# Patient Record
Sex: Female | Born: 1970 | Race: White | Hispanic: No | Marital: Married | Smoking: Never smoker
Health system: Southern US, Community
[De-identification: ages and names within clinical notes are randomized; demographics above are authoritative.]

## PROBLEM LIST (undated history)

## (undated) DIAGNOSIS — G510 Bell's palsy: Secondary | ICD-10-CM

## (undated) DIAGNOSIS — F909 Attention-deficit hyperactivity disorder, unspecified type: Secondary | ICD-10-CM

## (undated) DIAGNOSIS — D649 Anemia, unspecified: Secondary | ICD-10-CM

## (undated) DIAGNOSIS — F419 Anxiety disorder, unspecified: Secondary | ICD-10-CM

## (undated) HISTORY — PX: ABDOMINAL HYSTERECTOMY: SHX81

## (undated) HISTORY — DX: Anxiety disorder, unspecified: F41.9

## (undated) HISTORY — DX: Anemia, unspecified: D64.9

## (undated) HISTORY — DX: Bell's palsy: G51.0

## (undated) HISTORY — PX: LAPAROSCOPIC HYSTERECTOMY: SHX1926

---

## 2007-11-05 ENCOUNTER — Encounter: Admission: RE | Admit: 2007-11-05 | Discharge: 2007-11-05 | Payer: Self-pay | Admitting: Obstetrics and Gynecology

## 2008-05-05 ENCOUNTER — Encounter: Admission: RE | Admit: 2008-05-05 | Discharge: 2008-05-05 | Payer: Self-pay | Admitting: Obstetrics and Gynecology

## 2009-05-23 ENCOUNTER — Encounter: Admission: RE | Admit: 2009-05-23 | Discharge: 2009-05-23 | Payer: Self-pay | Admitting: Obstetrics & Gynecology

## 2009-06-26 ENCOUNTER — Ambulatory Visit (HOSPITAL_COMMUNITY): Admission: RE | Admit: 2009-06-26 | Discharge: 2009-06-27 | Payer: Self-pay | Admitting: Obstetrics & Gynecology

## 2009-06-26 ENCOUNTER — Encounter: Payer: Self-pay | Admitting: Obstetrics & Gynecology

## 2011-04-07 LAB — URINALYSIS, ROUTINE W REFLEX MICROSCOPIC
Glucose, UA: NEGATIVE mg/dL
Ketones, ur: NEGATIVE mg/dL
Protein, ur: NEGATIVE mg/dL
Specific Gravity, Urine: 1.015 (ref 1.005–1.030)
pH: 7.5 (ref 5.0–8.0)

## 2011-04-07 LAB — BASIC METABOLIC PANEL
BUN: 4 mg/dL — ABNORMAL LOW (ref 6–23)
BUN: 9 mg/dL (ref 6–23)
Calcium: 9.2 mg/dL (ref 8.4–10.5)
Chloride: 104 mEq/L (ref 96–112)
Creatinine, Ser: 0.54 mg/dL (ref 0.4–1.2)
GFR calc non Af Amer: >60 mL/min (ref >60)
GFR calc non Af Amer: >60 mL/min (ref >60)
Potassium: 4.3 mEq/L (ref 3.5–5.1)
Potassium: 4.6 mEq/L (ref 3.5–5.1)
Sodium: 137 mEq/L (ref 135–145)

## 2011-04-07 LAB — TYPE AND SCREEN: Antibody Screen: NEGATIVE

## 2011-04-07 LAB — CBC
HCT: 30.5 % — ABNORMAL LOW (ref 36.0–46.0)
HCT: 39 % (ref 36.0–46.0)
Hemoglobin: 10.5 g/dL — ABNORMAL LOW (ref 12.0–15.0)
MCHC: 34 g/dL (ref 30.0–36.0)
MCHC: 34.5 g/dL (ref 30.0–36.0)
MCV: 88.2 fL (ref 78.0–100.0)
RBC: 3.45 MIL/uL — ABNORMAL LOW (ref 3.87–5.11)
RDW: 14.2 % (ref 11.5–15.5)
RDW: 14.3 % (ref 11.5–15.5)
WBC: 11.6 10*3/uL — ABNORMAL HIGH (ref 4.0–10.5)

## 2011-04-07 LAB — ABO/RH: ABO/RH(D): O POS

## 2011-04-07 LAB — HCG, SERUM, QUALITATIVE: Preg, Serum: NEGATIVE

## 2011-04-16 ENCOUNTER — Other Ambulatory Visit: Payer: Self-pay | Admitting: Obstetrics & Gynecology

## 2011-04-16 DIAGNOSIS — Z1231 Encounter for screening mammogram for malignant neoplasm of breast: Secondary | ICD-10-CM

## 2011-04-22 IMAGING — MG MM SCREEN MAMMOGRAM BILATERAL
5 series · 5 of 5 positions shown · non-contrast
Comparison: none

DG SCREEN MAMMOGRAM BILATERAL
Bilateral CC and MLO view(s) were taken.

DIGITAL SCREENING MAMMOGRAM WITH CAD:
The breast tissue is heterogeneously dense.  No masses or malignant type calcifications are 
identified.  Compared with prior studies.

[R CC]
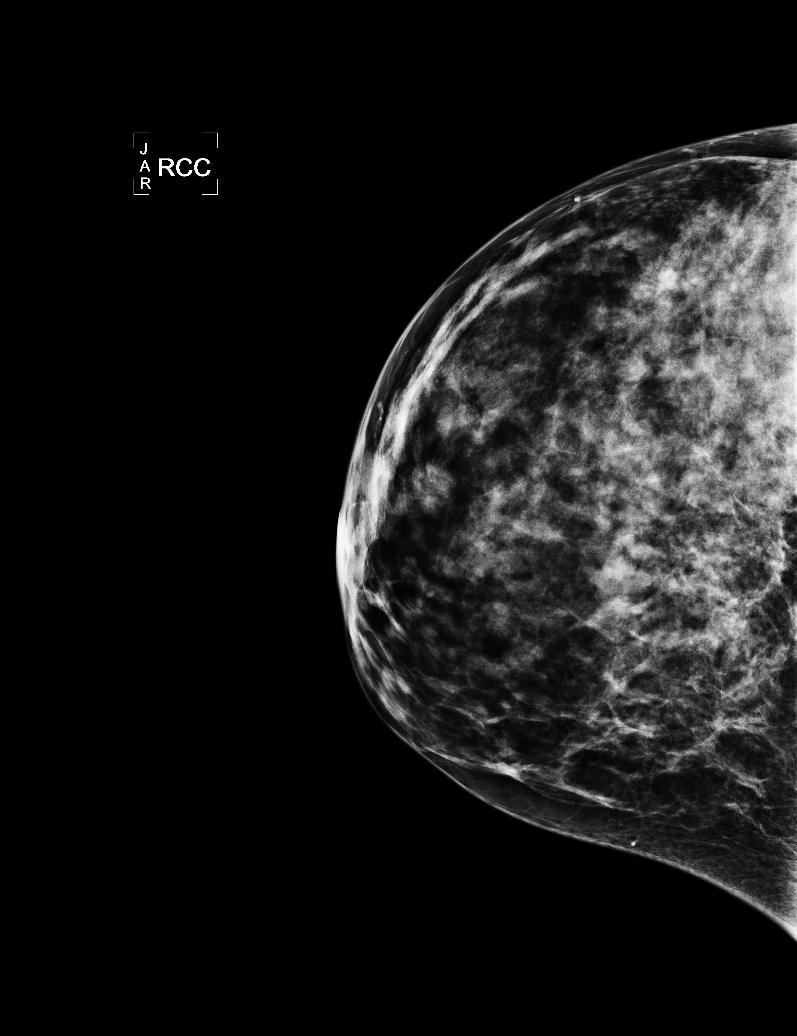

[L CC]
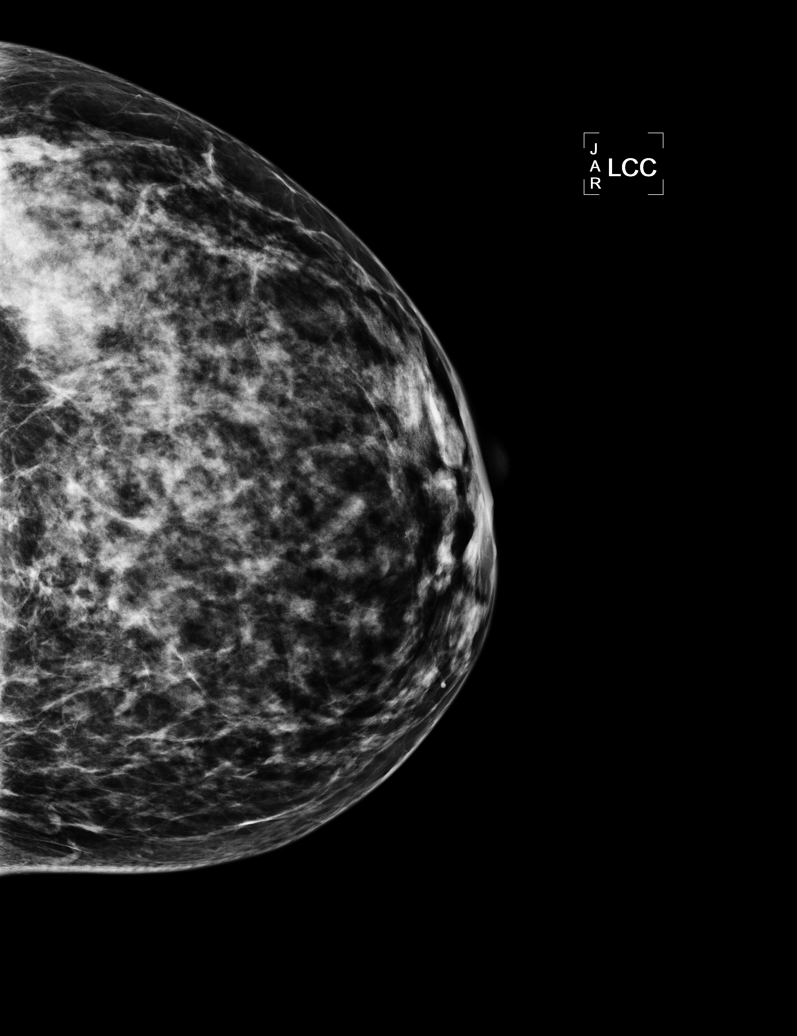

[L MLO]
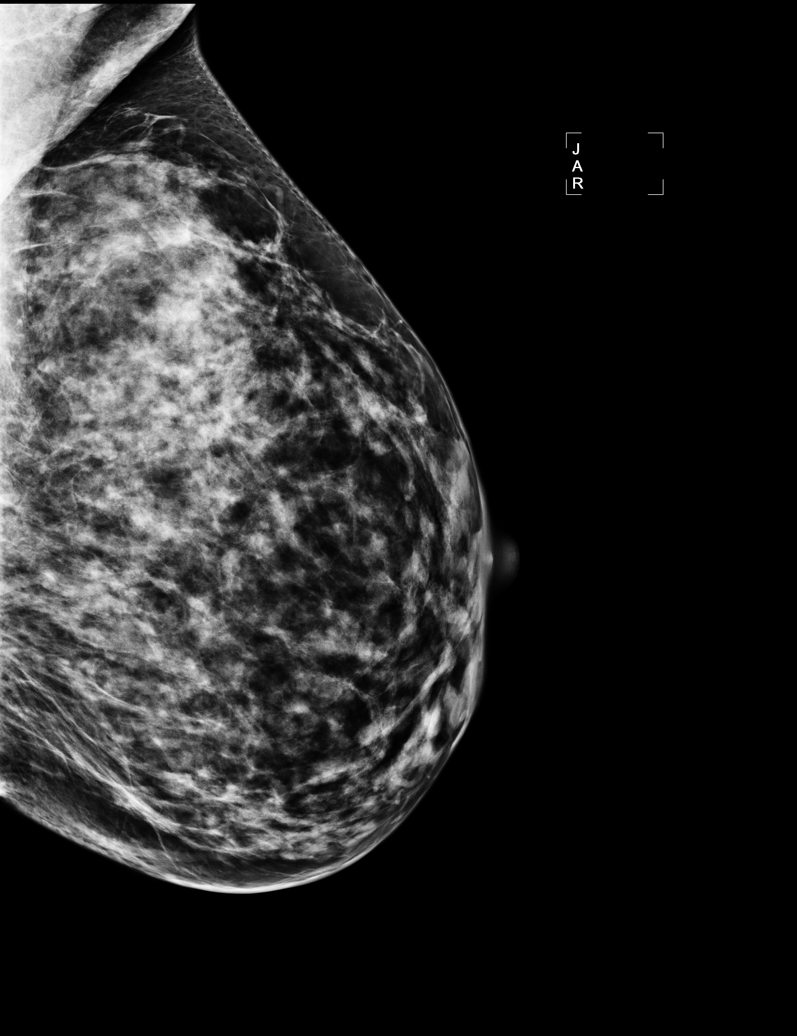

[R MLO (1 of 2)]
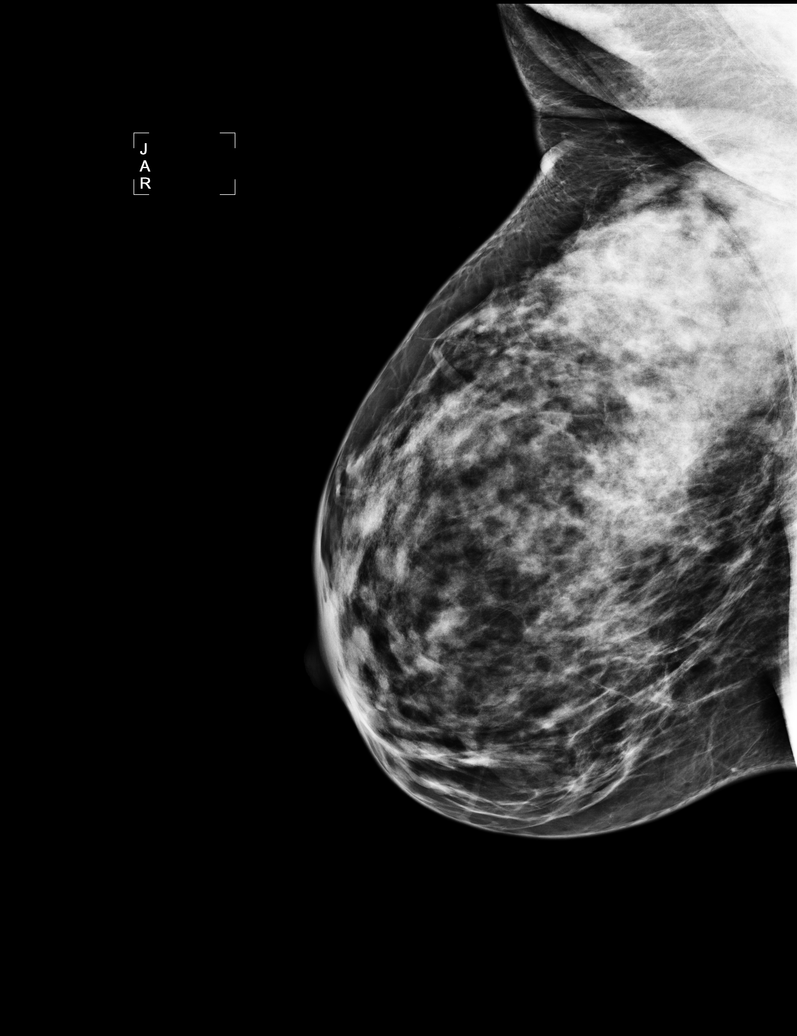

[R MLO (2 of 2)]
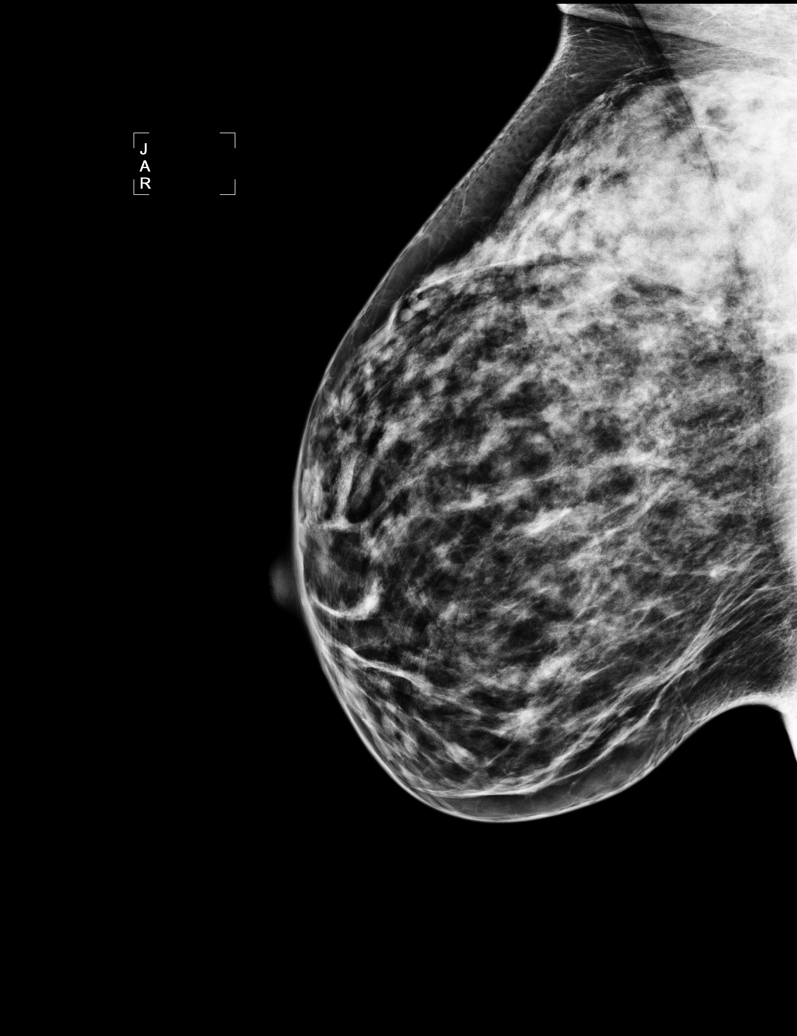

[5 of 5 positions shown; findings below may reference images not displayed]

IMPRESSION: No specific mammographic evidence of malignancy.  Next screening mammogram is recommended in one 
year.

A result letter of this screening mammogram will be mailed directly to the patient.

ASSESSMENT: Negative - BI-RADS 1

Screening mammogram in 1 year.
ANALYZED BY COMPUTER AIDED DETECTION. , THIS PROCEDURE WAS A DIGITAL MAMMOGRAM.

## 2011-05-07 ENCOUNTER — Ambulatory Visit: Payer: Self-pay

## 2011-05-13 NOTE — Op Note (Signed)
[name], [name]             ACCOUNT NO.:  [id number]   MEDICAL RECORD NO.:  [id number]          PATIENT TYPE:  OIB   LOCATION:  9319                          FACILITY:  [facility]   PHYSICIAN:  M. Leda Quail, MD  DATE OF BIRTH:  08/16/1971   DATE OF PROCEDURE:  DATE OF DISCHARGE:                               OPERATIVE REPORT   PREOPERATIVE DIAGNOSES:  67. A 40 year old G1 P1 married white female with menorrhagia.  2. Fibroid uterus.  3. Attention deficit disorder with hyperactivity.  4. Anxiety.   POSTOPERATIVE DIAGNOSES:  72. A 40 year old G1 P1 married white female with menorrhagia.  2. Fibroid uterus.  3. Attention deficit disorder with hyperactivity.  4. Anxiety.  5. Endometriosis.   PROCEDURES:  TLH with vaginal cuff closure, laparoscopic LSO, and  cautery of endometriosis.   SURGEON:  M. Leda Quail, MD   ASSISTANT:  Edwena Felty. Romine, MD   ANESTHESIA:  General endotracheal, Dr. Malen Gauze oversaw the case.   FINDINGS:  Endometriosis on bilateral ovaries with adhesions of the  right ovary to the sidewall.  These were filmy adhesions.  The left  ovary was adherent to the left sidewall.  There was endometriosis of the  anterior abdominal wall of the left uterosacral ligament and in the  posterior cul-de-sac.   SPECIMENS:  Uterus, cervix, left tube and ovary sent to pathology.   ESTIMATED BLOOD LOSS:  300 mL.   URINE OUTPUT:  350 mL.   FLUIDS:  2400 mL of LR.   COMPLICATIONS:  None.   INDICATIONS:  [name] is a 40 year old G1 P1 married white female  who is completely done with childbearing and suffers from significant  menorrhagia associated with pelvic pain.  She underwent an ultrasound  for evaluation which showed multiple fibroids.  This was done after her  uterus felt enlarged on exam.  She was initially considering an  ablation.  We discussed risks and benefits including the fact that  possible endometriosis or adenomyosis could be present with  fibroids.  She considered seriously failure rates of alternative therapies  including an IUD and endometrial ablation.  She ultimately decided to  proceed with definitive management.  Risks and benefits of all these  have been discussed with her and documented in our office chart.   PROCEDURE:  The patient was taken to the operating room.  She was placed  in supine position.  Anesthesia was administered by the anesthesia staff  without difficulty.  Legs were positioned in low lithotomy position in  Alcalde stirrups.  The abdomen, perineum, inner thighs, and vagina were  prepped in normal sterile fashion.  She was draped in normal sterile  fashion.  Attention was initially turned to the vagina.  Legs were  positioned to the high lithotomy position.  A short weighted speculum  placed in the posterior aspect of the vagina.  Cervix was visualized was  grasped with a single-tooth tenaculum.  The uterus was sounded to 10 cm.  The RUMI uterine manipulator with a #10 disposable tip was obtained.  This plus vaginal occlusive device was placed on the RUMI uterine  manipulator.  Cervix was dilated up to #21 with Shawnie Pons dilators.  Then  the RUMI uterine manipulator with a large Koh ring attached was passed  through the cervical canal.  The Koh ring was applied around the cervix  without difficulty.  Then the 5 mm balloon on the disposable RUMI tip  was inflated to hold the uterus in place.  There was good seal of the  Koh ring around the cervix and there was also good manipulation of the  uterus.  Foley was inserted under sterile conditions to straight drain.  The legs were positioned back in the low lithotomy position and the  heavy weighted speculum was removed from the posterior aspect of the  vagina.  Sterile gown and gloves were changed by the operator this  point.  Attention was turned to the abdomen.  A 5 mL of 0.25% Marcaine  were instilled infraumbilically.  A 10 mm skin incision was made with  a  #11 blade.  The abdominal wall layer was elevated and a Veress needle  was inserted into the abdomen to direct our aiming toward the pelvis.  Once peritoneum was popped through, a syringe of saline was obtained.  This was attached to the Veress needle and aspiration was performed,  without any blood or fluid being noted.  Fluid was injected easily in  the pelvis and aspiration was performed again without any blood or  fluid.  Fluid dripped easily into the pelvis at this point.  CO2 gas was  attached to the Veress needle and under low flow a pneumoperitoneum was  achieved without difficulty.  Once 2-1/2 liters of gas was in the  abdomen, the Veress needle was removed.  A 10-mm Optiview non-bladed  trocar and port were obtained.  These were attached to the laparoscope.  Under direct visualization of the laparoscope, the abdominal wall layers  were traversed as the Optiview scope was passed into the abdomen.  The  non-bladed trocar was removed.  The patient was placed in Trendelenburg.  The pelvis and upper abdomen were surveyed.  There was endometriosis on  the right ovary.  The left ovary was adhered to the left sidewall  although it was freed by manipulation of the uterus.  There was  endometriosis on the backside of the ovary.  There was endometriosis on  the left uterosacral ligament and posterior cul-de-sac and as well on  the anterior abdominal wall.  The upper abdomen was surveyed.  The  appendix was normal.  The gallbladder was normal.  The liver looked  normal and the stomach was normal.  Photo documentation was obtained.  Then the abdominal wall was transilluminated and port site locations in  the right and left lower quadrant were chosen.  Skin was anesthetized  with 0.25% Marcaine and 5 mm skin incisions were made with a #11 blade.  The 5 mm non-bladed trocar ports were placed in the right and left lower  quadrant under direct visualization of laparoscope.  The trocars were   removed.  Then a million dollar endoscopic grasper and gyrus, bipolar  cautery was obtained.  Decision was made at this point to keep the right  ovary and treat the endometriosis on the ovary and to take the left  ovary because of the adherent adhesions.  The adhesions on the right  ovary were incised sharply with endoscopic scissors.  The uterus was  then placed on stretch to the patient's right side.  The left IP  ligament was serially  clamped, cauterized, and incised.  The ureter was  visualized before and after transecting the IP ligament.  The left round  ligament was serially clamped, cauterized, and incised.  The filmy  tissue of the inferior portion of the broad ligament was serially  clamped, cauterized, and incised.  Then, using endoscopic scissors, the  anterior peritoneum was incised to the level of the internal os  anteriorly and then across the backside of the uterus.  This helped free  the ureter and allow the ureter to fall away from the uterine artery on  left side.  The left uterine artery was skeletonized at this point.  Then the avascular tissue between the bladder and the cervix was  identified.  The bladder was pushed down with the cervix to the level  over the [facility] ring.  Then the left uterine artery was serially  cauterized, but not incised at this point.  Then attention was turned to  the right side.  The uterus was placed on stretch to the left.  The  utero-ovarian pedicle was serially clamped, cauterized, and incised.  The right round ligament was clamped, cauterized, and incised.  The  remainder of the inferior leaf of the broad ligament was clamped,  cauterized, and incised.  Then the anterior peritoneum was incised with  endoscopic scissors to the level of the internal os meeting the previous  incision.  The bladder flap was created.  The avascular tissue between  the bladder and the cervix was also identified on this side and the  bladder was pushed down on  the cervix on both sides equally to place the  bladder below the level of the Koh ring.  The uterine artery on the  right side was skeletonized.  The posterior peritoneum, posterior to the  uterus was incised allowing the ureter to fall out to the right side.  Then at the level of the internal os, the uterine artery on the right  side was serially clamped and incised.  Then at the level of the [facility]  ring, the uterine artery was again serially clamped, cauterized and  incised, allowing a place for the colpotomy to be performed medial to  the transection of the uterine artery.  Attention was turned back to the  left side and the uterine artery at approximately the level of the [facility]  ring was serially clamped, cauterized, and incised.  There was good  hemostasis from the uterine arteries bilaterally.  Using monopolar  cautery with endoscopic scissor tip, colpotomy was performed starting  anteriorly working around the cervix inside the location of the  transection of the uterine artery posteriorly, inside the uterosacral  ligaments and then completing over on the left side.  At this point, the  uterus, cervix, the left tube, and ovary were removed from the pelvis  vaginally.  Legs were positioned in the high lithotomy position.  The  patient was taken out of the Trendelenburg positioning.  The vaginal  cuff was closed with six figure-of-eight sutures of 0 Vicryl.  There was  bleeding along the vaginal cuff, there was a small bumper that was  eventually identified and sutured.  Then at this point attention was  turned back to the abdomen.  Pneumoperitoneum was re-achieved.  The  pelvis was irrigated.  There was a small amount of bleeding on the  posterior aspect of the vaginal cuff which was cauterized with bipolar  cautery.  This was visualized for several minutes to ensure there was no  additional bleeding.  Ureter peristalsis was noted bilaterally.  Then  using monopolar cautery, the  endometriosis that was present in the  posterior cul-de-sac on the uterosacral ligament along the left sidewall  and the anterior abdominal wall and the right ovary were cauterized  using monopolar cautery.  Care was taken to identify all important  structures before any cauterization was performed in any location.  At  this point, the procedure was ended.  The cuff was revisualized and it  was irrigated with normal saline.  No bleeding was noted.  Pedicles were  all hemostatic at this point.  The right and left lower quadrant ports  removed under direct visualization of laparoscope.  The pneumoperitoneum  was released.  The laparoscope was removed.  The patient was given  several deep breaths by the CRNA to try and as much gas out of the  abdomen as possible.  Then the midline port was removed.  Using S  retractors at the umbilicus, fascia was identified, it was elevated and  sutured with a figure-of-eight suture of 0 Vicryl.  Skin was then closed  with subcuticular stitch of 3-0 Vicryl.  Dermabond was applied to this  incision to make it airtight.  The right and left lower quadrants  incisions were washed and dried and closed using Dermabond.  Foley  catheter was left in the bladder.  The vagina was wiped with a sponge  stick with Ray-Tec present, minimal spotting was noted.  At this point,  the patient was taking clearly out of Trendelenburg.  Her legs were  positioned back in supine position.  As the foot of the table was being  lifted, her right hand was visualized as it was tucked.  Sponge, lap,  needle, and instrument counts were correct x2.  Uterus, cervix, left  tube and ovary were sent to pathology.  The patient was awakened from  anesthesia, extubated in stable condition.  She was taken to the  recovery room at this point.      Lum Keas, MD     MSM/MEDQ  D:  06/26/2009  T:  06/27/2009  Job:  [id number]

## 2011-05-13 NOTE — Discharge Summary (Signed)
NAMELawrie, [name]             ACCOUNT NO.:  [id number]   MEDICAL RECORD NO.:  [id number]          PATIENT TYPE:  OIB   LOCATION:  9319                          FACILITY:  [facility]   PHYSICIAN:  M. Leda Quail, MD  DATE OF BIRTH:  14-Apr-1971   DATE OF ADMISSION:  06/26/2009  DATE OF DISCHARGE:  06/27/2009                               DISCHARGE SUMMARY   ADMISSION DIAGNOSES:  30. A 40 year old G1, P1 married white female with menorrhagia.  2. Fibroids.  3. Pelvic pain.  4. Attention deficit hyperactivity disorder.   DISCHARGE DIAGNOSES:  53. A 40 year old G1, P1 married white female with menorrhagia.  2. Fibroids.  3. Pelvic pain.  4. Attention deficit hyperactivity disorder.  5. Endometriosis.   PROCEDURES:  1. Total laparoscopic hysterectomy with vaginal cuff closure.  2. Laparoscopic salpingo-oophorectomy.  3. Cautery of endometriosis.   HISTORY OF PRESENT ILLNESS:  Written H and P is on the chart, but in  brief, [name] is a very nice 40 year old, G1, P1 married white  female, who has a history of significant menorrhagia and pelvic pain,  which is around the time of her cycle.  This has worsened over the last  several years.  She has been on multiple birth control options and none  have really helped significantly.  She is currently seeking a second  opinion about options.  We did perform an office ultrasound and  endometrial biopsy.  Ultrasound showed multiple fibroids.  No clear  adenomyosis or endometriosis was identified.  We discussed options  including a Mirena IUD, endometrial ablation, and a hysterectomy.  The  patient considered options and decided to proceed with hysterectomy  because she wanted definitive management.  Risks and benefits have been  discussed with her and are documented in her office chart.   HOSPITAL COURSE:  The patient was admitted to Same Day Surgery.  She was  taken to the operating room where a TLH, LSO, and cautery of  endometriosis was performed.  She had 300 mL of blood loss.  She was  very stable during the procedure.  From the operating room, she was  taken to the recovery room and from recovering room to the third floor  for the remainder of her hospitalization.  She was stable throughout the  night and the next morning when she was seen on rounds.  T-max was 98.4,  pulse was then 50s to 70s, respirations 14-20, BP 85-102/54-69.  Pulse  ox was 97-100% after her 2 liters of nasal cannula was discontinued.  The patient made 3800 mL of urine output, which was clear in the  previous 24 hours and by the time I saw her, she had already voided once  400 mL.  Postop labs showed a white count 11.6, hemoglobin 10.5,  platelet count 223.  Electrolytes were totally normal with a sodium 136,  potassium 4.3, BUN and creatinine 4 and 0.4 respectively.  Her exam was  completely benign.  Abdomen was soft, nondistended, and good bowel  sounds and mildly tender as expected.  Her incisions were clean, dry,  and intact.  Pad was  dry.  At this point, her IV was discontinued.  Her  Dilaudid PCA was discontinued.  Her Pepcid was discontinued.  She was  advanced to regular diet.  She was up to ambulate.  The plan will be for  her to be discharged to home later today.   Discharge instructions were provided in the written verbal form.  Specifically, she is to call if she has a temperature greater than  100.5, redness or drainage from her incisions, or heavy vaginal  bleeding.  She will be called with her pathology report.  Postoperative  followup is in 1 week, and she has prescriptions for Percocet 5/325 one  to two tablets was p.o. 4-6 hours p.r.n. pain and Motrin 800 mg 1 every  8 hours as needed for pain.      Lum Keas, MD     MSM/MEDQ  D:  06/27/2009  T:  06/27/2009  Job:  [id number]

## 2011-06-03 ENCOUNTER — Ambulatory Visit: Payer: Self-pay

## 2011-08-18 DIAGNOSIS — F988 Other specified behavioral and emotional disorders with onset usually occurring in childhood and adolescence: Secondary | ICD-10-CM | POA: Insufficient documentation

## 2011-08-18 DIAGNOSIS — F329 Major depressive disorder, single episode, unspecified: Secondary | ICD-10-CM | POA: Insufficient documentation

## 2011-08-18 DIAGNOSIS — F3342 Major depressive disorder, recurrent, in full remission: Secondary | ICD-10-CM | POA: Insufficient documentation

## 2011-09-11 ENCOUNTER — Ambulatory Visit
Admission: RE | Admit: 2011-09-11 | Discharge: 2011-09-11 | Disposition: A | Discharge status: Home / Self Care | Payer: BC Managed Care – PPO | Source: Ambulatory Visit | Attending: Obstetrics & Gynecology | Admitting: Obstetrics & Gynecology

## 2011-09-11 DIAGNOSIS — Z1231 Encounter for screening mammogram for malignant neoplasm of breast: Secondary | ICD-10-CM

## 2012-09-08 ENCOUNTER — Other Ambulatory Visit: Payer: Self-pay | Admitting: Obstetrics & Gynecology

## 2012-09-08 DIAGNOSIS — Z1231 Encounter for screening mammogram for malignant neoplasm of breast: Secondary | ICD-10-CM

## 2012-10-13 ENCOUNTER — Ambulatory Visit
Admission: RE | Admit: 2012-10-13 | Discharge: 2012-10-13 | Disposition: A | Discharge status: Home / Self Care | Payer: BC Managed Care – PPO | Source: Ambulatory Visit | Attending: Obstetrics & Gynecology | Admitting: Obstetrics & Gynecology

## 2012-10-13 DIAGNOSIS — Z1231 Encounter for screening mammogram for malignant neoplasm of breast: Secondary | ICD-10-CM

## 2012-12-08 ENCOUNTER — Other Ambulatory Visit: Payer: Self-pay | Admitting: Gynecology

## 2012-12-09 ENCOUNTER — Other Ambulatory Visit: Payer: Self-pay | Admitting: Gynecology

## 2012-12-09 DIAGNOSIS — N6320 Unspecified lump in the left breast, unspecified quadrant: Secondary | ICD-10-CM

## 2012-12-09 DIAGNOSIS — N644 Mastodynia: Secondary | ICD-10-CM

## 2012-12-17 ENCOUNTER — Ambulatory Visit
Admission: RE | Admit: 2012-12-17 | Discharge: 2012-12-17 | Disposition: A | Discharge status: Home / Self Care | Payer: BC Managed Care – PPO | Source: Ambulatory Visit | Attending: Gynecology | Admitting: Gynecology

## 2012-12-17 DIAGNOSIS — N6320 Unspecified lump in the left breast, unspecified quadrant: Secondary | ICD-10-CM

## 2012-12-17 DIAGNOSIS — N644 Mastodynia: Secondary | ICD-10-CM

## 2013-05-04 ENCOUNTER — Telehealth: Payer: Self-pay | Admitting: Obstetrics & Gynecology

## 2013-05-04 NOTE — Telephone Encounter (Signed)
Patient called to request a change in medication, Elestrin Gel, . On her last visit while you were out she saw another provider and was told due to her Estridiol level to use only one pump a day. Patient is calling to request she go back to 2 pumps a day as you originally had her on due to the way she is responding to only one pump Elestrin Gel a day. Please advise. Patient went ahead and make her AEX with you in July/2014 to be sure she gets to see you .

## 2013-05-04 NOTE — Telephone Encounter (Signed)
PRESCRIPTION QUESTION FOR A NURSE

## 2013-05-05 MED ORDER — ESTRADIOL 0.52 MG/0.87 GM (0.06%) TD GEL: 1.0000 "application " | Freq: Two times a day (BID) | TRANSDERMAL | Status: DC

## 2013-05-05 NOTE — Telephone Encounter (Signed)
She was having increased breast pain and nodularities in breast with the two pumps a day.  Also her estradiol level was quite high.  She should try alternating one pump one day, then two pumps the next.  This is a transdermal gel so it is safe to use it that way.  If her AEX is in July, then this will be a good time for discussing how it is working.

## 2013-05-05 NOTE — Telephone Encounter (Signed)
I placed order.   I haven't closed encounter.

## 2013-05-05 NOTE — Telephone Encounter (Signed)
Patient notified of Rx sent to pharmacy.

## 2013-05-05 NOTE — Telephone Encounter (Signed)
Patient understands instructions per Dr. Hyacinth Meeker and does have scheduled appt. In July for AEX. Patient also request if a new Rx could be sent to her pharmacy [facility] , [place], [place]. For Elestrin Gel so she may take advantage of a coupon for this Rx to be used. Please advise. sue

## 2013-05-30 ENCOUNTER — Telehealth: Payer: Self-pay | Admitting: Gynecology

## 2013-05-30 NOTE — Telephone Encounter (Signed)
Routed to Dr. Lathrop  

## 2013-05-30 NOTE — Telephone Encounter (Signed)
Her Rx for   Estradiol 0.52 MG/0.87 GM (0.06%) GEL [employer] does't want to pay for  Anymore.    [address]. [facility].

## 2013-05-31 ENCOUNTER — Telehealth: Payer: Self-pay

## 2013-05-31 MED ORDER — ESTRADIOL 0.05 MG/24HR TD PTWK: 1.0000 | MEDICATED_PATCH | TRANSDERMAL | Status: DC

## 2013-05-31 NOTE — Telephone Encounter (Signed)
Can you call [name] and see if she knows what is covered by her plan?

## 2013-05-31 NOTE — Telephone Encounter (Signed)
You will need to forward request to Dr Hyacinth Meeker then

## 2013-05-31 NOTE — Telephone Encounter (Signed)
Notified pt of this; pt says she's grateful for Dr. Wynelle Bourgeois opinion, but she's Dr. Rondel Baton pt and she would like Dr. Rondel Baton opinion. Elestrin Samples here expire 6/14, Please Advise.

## 2013-05-31 NOTE — Telephone Encounter (Signed)
I forwarded it to her

## 2013-05-31 NOTE — Telephone Encounter (Signed)
Patient notified of change. Will call back in one month with update or prn.

## 2013-05-31 NOTE — Telephone Encounter (Signed)
Climara 0.05mg  patches once weekly.  We may have to go up on dosage so she should call and give update about one month.

## 2013-05-31 NOTE — Telephone Encounter (Signed)
Can try either vivelle dot or oral estrogen at equivalent doses, ok to call either in for 30d with refill until Augest

## 2013-05-31 NOTE — Telephone Encounter (Signed)
Patient checked with her plan-climara and estrace are on the Tier 1, vivelle dot is Tier 2. She is fine with whatever Dr Hyacinth Meeker thinks is the best fit for her. Is doing really well on the elestrin and c/o changing causing weight gain.

## 2013-06-16 ENCOUNTER — Telehealth: Payer: Self-pay

## 2013-06-16 NOTE — Telephone Encounter (Signed)
Spoke with patient to offer her samples of the Elestrin. States she has already started the climara 0.05mg  patch x 3 weeks. States is not sleeping as well and achy feeling, but will continue to try the climara for a couple more weeks. Will call back to give update or if sx worsen.

## 2013-07-04 HISTORY — PX: MYRINGOTOMY WITH TUBE PLACEMENT: SHX5663

## 2013-07-05 ENCOUNTER — Inpatient Hospital Stay (HOSPITAL_COMMUNITY)
Admission: EM | Admit: 2013-07-05 | Discharge: 2013-07-07 | DRG: 019 | Disposition: A | Discharge status: Home / Self Care | Payer: BC Managed Care – PPO | Attending: Otolaryngology | Admitting: Otolaryngology

## 2013-07-05 ENCOUNTER — Inpatient Hospital Stay: Admission: AD | Admit: 2013-07-05 | Payer: Self-pay | Source: Ambulatory Visit | Admitting: Otolaryngology

## 2013-07-05 ENCOUNTER — Inpatient Hospital Stay (HOSPITAL_COMMUNITY): Payer: BC Managed Care – PPO

## 2013-07-05 ENCOUNTER — Encounter (HOSPITAL_COMMUNITY): Payer: Self-pay | Admitting: Emergency Medicine

## 2013-07-05 DIAGNOSIS — H6690 Otitis media, unspecified, unspecified ear: Secondary | ICD-10-CM | POA: Diagnosis present

## 2013-07-05 DIAGNOSIS — G510 Bell's palsy: Principal | ICD-10-CM | POA: Diagnosis present

## 2013-07-05 HISTORY — DX: Attention-deficit hyperactivity disorder, unspecified type: F90.9

## 2013-07-05 MED ORDER — ARTIFICIAL TEARS OP OINT
TOPICAL_OINTMENT | Freq: Every evening | OPHTHALMIC | Status: DC | PRN
  Administered 2013-07-05: 21:00:00 via OPHTHALMIC
  Filled 2013-07-05: qty 3.5

## 2013-07-05 MED ORDER — PIPERACILLIN-TAZOBACTAM 3.375 G IVPB
3.3750 g | Freq: Once | INTRAVENOUS | Status: AC
  Administered 2013-07-05: 3.375 g via INTRAVENOUS
  Filled 2013-07-05: qty 50

## 2013-07-05 MED ORDER — POLYVINYL ALCOHOL 1.4 % OP SOLN
1.0000 [drp] | OPHTHALMIC | Status: DC | PRN
  Administered 2013-07-05: 1 [drp] via OPHTHALMIC
  Filled 2013-07-05: qty 15

## 2013-07-05 MED ORDER — CIPROFLOXACIN-DEXAMETHASONE 0.3-0.1 % OT SUSP
4.0000 [drp] | Freq: Two times a day (BID) | OTIC | Status: DC
  Administered 2013-07-05 – 2013-07-07 (×4): 4 [drp] via OTIC
  Filled 2013-07-05: qty 7.5

## 2013-07-05 MED ORDER — METHYLPREDNISOLONE SODIUM SUCC 125 MG IJ SOLR
125.0000 mg | Freq: Once | INTRAMUSCULAR | Status: AC
  Administered 2013-07-05: 125 mg via INTRAVENOUS
  Filled 2013-07-05: qty 2

## 2013-07-05 MED ORDER — METHYLPREDNISOLONE SODIUM SUCC 125 MG IJ SOLR
125.0000 mg | Freq: Three times a day (TID) | INTRAMUSCULAR | Status: DC
  Administered 2013-07-05 – 2013-07-07 (×5): 125 mg via INTRAVENOUS
  Filled 2013-07-05 (×8): qty 2

## 2013-07-05 MED ORDER — PIPERACILLIN-TAZOBACTAM 3.375 G IVPB
3.3750 g | Freq: Three times a day (TID) | INTRAVENOUS | Status: DC
  Administered 2013-07-05 – 2013-07-07 (×5): 3.375 g via INTRAVENOUS
  Filled 2013-07-05 (×8): qty 50

## 2013-07-05 NOTE — Progress Notes (Signed)
[facility]  Patient Status: Active (receiving services up to time of hospitalization)  [facility] is providing the following services: RN Was referred for IV antibiotics but was admitted to the hospital prior to being seen by [facility].  If patient discharges after hours, please call [phone].   Kizzie Furnish 07/05/2013, 5:34 PM

## 2013-07-05 NOTE — H&P (Signed)
[name] is an 42 y.o. female.   Chief Complaint: Right otitis media and facial nerve paralysis HPI: 42 year old who developed an ear infection about a week ago. It did improve and apparently a perforation was present at that time evaluated by a otolaryngologist. She presented to the office yesterday with new onset facial nerve weakness and a middle ear effusion on the right. A tympanostomy tube was placed and there was serous effusion within the middle ear. Because of the weakness of the face it was felt that starting her on intravenous antibiotics for a short time was appropriate and [facility]. The have not even as of this morning started the antibiotics so she is now being admitted to the hospital for the therapy. She has not had this problem previously. Does not have her the current issue with otitis media. Has not had an upper respiratory infection. She has a numbness feeling of her ear preauricular area and also has a decreased sense of taste in the right side of her mouth. No headache or pressure behind the eye.  No past medical history on file.  No past surgical history on file.  No family history on file. Social History:  has no tobacco, alcohol, and drug history on file.  Allergies: Allergies not on file  No prescriptions prior to admission    No results found for this or any previous visit (from the past 48 hour(s)). No results found.  Review of Systems  Constitutional: Negative.   HENT: Positive for hearing loss and ear pain.   Eyes: Negative.   Respiratory: Negative.   Cardiovascular: Negative.   Skin: Negative.     There were no vitals taken for this visit. Physical Exam  Constitutional: She appears well-developed.  HENT:  Nose: Nose normal.  Mouth/Throat: Oropharynx is clear and moist.  Right ear w tube and some irritation of the canal.  The tube looks open. The right face has weakness of all branches but still some movement. No  erythema around the skin of the ear. Some pain in the mastoid area.  Eyes: Pupils are equal, round, and reactive to light.  Neck: Normal range of motion. Neck supple.  Cardiovascular: Normal rate.   Respiratory: Effort normal.  GI: Soft.     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 She also will get a high-resolution CT scan of the temporal bone.  Suzanna Obey 07/05/2013, 10:34 AM

## 2013-07-05 NOTE — ED Notes (Signed)
Pt presents to ED for IV start to be admitted to hospital for IV antibiotics.

## 2013-07-06 NOTE — Progress Notes (Signed)
Subjective: She was admitted for regression or facial nerve weakness in the face of otitis media. Her CT scan did not show any evidence of destructive bone or intracranial complications. There is no evidence of cholesteatoma. She feels well. She feels like perhaps the face is a little better. No pain. His no drainage from the ear.  Objective: Vital signs in last 24 hours: Temp:  [98 F (36.7 C)-98.6 F (37 C)] 98 F (36.7 C) (07/09 0556) Pulse Rate:  [87-104] 98 (07/09 0556) Resp:  [14-18] 18 (07/09 0556) BP: (111-132)/(64-86) 111/64 mmHg (07/09 0556) SpO2:  [97 %-100 %] 98 % (07/09 0556) Weight:  [72.576 kg (160 lb)-73.483 kg (162 lb)] 73.482 kg (162 lb) (07/08 1639) Last BM Date: 07/02/13  Intake/Output from previous day: 07/08 0701 - 07/09 0700 In: 410 [P.O.:360; IV Piggyback:50] Out: -  Intake/Output this shift:    She looks good. There is no evidence of erythema or redness of the ear. The facial nerve still is definitely weak and I cannot appreciate a significant improvement but there may be better tone. There are no vesicles or lesions.  Lab Results:  No results found for this basename: WBC, HGB, HCT, PLT,  in the last 72 hours BMET No results found for this basename: NA, K, CL, CO2, GLUCOSE, BUN, CREATININE, CALCIUM,  in the last 72 hours PT/INR No results found for this basename: LABPROT, INR,  in the last 72 hours ABG No results found for this basename: PHART, PCO2, PO2, HCO3,  in the last 72 hours  Studies/Results: Ct Temporal Bones W/o Cm  07/05/2013   *RADIOLOGY REPORT*  Clinical Data: Ear infection.  Right facial weakness.  Tympanic membrane perforation on the right.  CT TEMPORAL BONES WITHOUT CONTRAST  Technique:  Axial and coronal plane CT imaging of the petrous temporal bones was performed with thin-collimation image reconstruction.  No intravenous contrast was administered. Multiplanar CT image reconstructions were also generated.  Comparison: None.  Findings:  Normal right external ear.  Tympanostomy tube has been placed in the right tympanic membrane with slight retraction of the TM. There has been good drainage of the inferior right middle ear cavity.  The fluid which remains is more superomedial to the malleoincudal junction.   Right middle ear and mastoid fluid without signs of osseous destruction.  No fluid in Prussak's space.  Scutum preserved.  Ossicles intact.  No tegmen mastoideum or tegmen tympani destruction.  No visible destruction of the osseous facial nerve canal on the right either in its pre-geniculate, horizontal, or vertical components.  Normal appearing inner ear structures within limits of evaluation on CT.  Normal left ear.  Normal visualized intracranial compartment. Mucosal thickening is present in the bilateral ethmoid and maxillary sinuses.  IMPRESSION: Right otitis media and mastoid fluid consistent with acute otitis and mastoiditis.  No evidence for cholesteatoma or osseous destruction.  Visualized facial nerve bony canal is intact.  Right tympanostomy tube good position, with no significant fluid in the lower portion of the right middle ear.   Original Report Authenticated By: Davonna Belling, M.D.    Anti-infectives: Anti-infectives   Start     Dose/Rate Route Frequency Ordered Stop   07/05/13 1515  piperacillin-tazobactam (ZOSYN) IVPB 3.375 g     3.375 g 12.5 mL/hr over 240 Minutes Intravenous 3 times per day 07/05/13 1511     07/05/13 1315  piperacillin-tazobactam (ZOSYN) IVPB 3.375 g     3.375 g 12.5 mL/hr over 240 Minutes Intravenous  Once 07/05/13 1305  07/05/13 1712      Assessment/Plan: s/p * No surgery found * She continues with the facial nerve weakness. The CT scan showed mastoiditis but no cholesteatoma. We talked about options and she will stay for another day with intravenous steroids and antibiotics and then possibly plan on discharge tomorrow provided no new findings or problems.  LOS: 1 day    Suzanna Obey 07/06/2013

## 2013-07-07 NOTE — Discharge Summary (Signed)
Physician Discharge Summary  Patient ID: [name] MRN: [id number] DOB/AGE: 44-Sep-1972 44 y.o.  Admit date: 07/05/2013 Discharge date: 07/07/2013  Admission Diagnoses:facial paralysis and otitis media   Discharge Diagnoses: same Active Problems:   * No active hospital problems. *   Discharged Condition: good  Hospital Course: she was admitted for facial nerve paralysis and otitis media complication. She was placed on steroids and antibiotics. She is now doing well and has some improvement in the facial nerve. She's had no drainage. She's had no headache. She feels great and is ready to go home. She can now be transitioned to by mouth antibiotics and steroids. She will followup in one week. She understands to followup sooner if she is having any new issues or worsening problems.  Consults: None  Significant Diagnostic Studies: None   Treatments: none   Discharge Exam: Blood pressure 105/65, pulse 74, temperature 98.4 F (36.9 C), temperature source Oral, resp. rate 16, height 5\' 4"  (1.626 m), weight 73.482 kg (162 lb), SpO2 98.00%. Awake and alert. Very energetic and has no complaints. Her eyes without irritation or erythema. There is no tearing. She has improved movement in her face. She has no pain. There is no headaches. The ear is without drainage or evidence of swelling. Lungs are clear. Heart is regular. Abdomen is soft. Extremities without tenderness.  Disposition:    Future Appointments Provider Department Dept Phone   07/27/2013 10:00 AM Annamaria Boots, MD [facility] [phone]       Medication List         Biotin 10 MG Caps  Take 1 tablet by mouth daily.     buPROPion 200 MG 12 hr tablet  Commonly known as:  WELLBUTRIN SR  Take 200 mg by mouth 2 (two) times daily.     CALCIUM 1200 PO  Take 1 tablet by mouth 2 (two) times daily.     ciprofloxacin-dexamethasone otic suspension  Commonly known as:  CIPRODEX  Place 4 drops into the  right ear 2 (two) times daily.     estradiol 0.05 mg/24hr  Commonly known as:  CLIMARA - Dosed in mg/24 hr  Place 1 patch (0.05 mg total) onto the skin once a week.     ibuprofen 200 MG tablet  Commonly known as:  ADVIL,MOTRIN  Take 200-600 mg by mouth every 6 (six) hours as needed for pain.     levofloxacin 750 MG tablet  Commonly known as:  LEVAQUIN  Take 750 mg by mouth daily. For 10 days. Started 07/04/13     methylphenidate 20 MG tablet  Commonly known as:  RITALIN  Take 20 mg by mouth 2 (two) times daily.     multivitamin with minerals tablet  Take 1 tablet by mouth daily.         SignedSuzanna Obey 07/07/2013, 9:16 AM

## 2013-07-07 NOTE — Progress Notes (Signed)
  Subjective: She is doing well. The face is slightly better. No drainage . No headache. She feels great and ready to go home  Objective: Vital signs in last 24 hours: Temp:  [96.6 F (35.9 C)-98.5 F (36.9 C)] 98.4 F (36.9 C) (07/10 0630) Pulse Rate:  [74-98] 74 (07/10 0630) Resp:  [15-16] 16 (07/10 0630) BP: (105-118)/(65-70) 105/65 mmHg (07/10 0630) SpO2:  [98 %-100 %] 98 % (07/10 0630) Last BM Date: 07/02/13  Intake/Output from previous day: 07/09 0701 - 07/10 0700 In: -  Out: 3 [Urine:3] Intake/Output this shift:    still with facial weakness but slight movement. the eye is moist and no irritation. no drainage from ear.   Lab Results:  No results found for this basename: WBC, HGB, HCT, PLT,  in the last 72 hours BMET No results found for this basename: NA, K, CL, CO2, GLUCOSE, BUN, CREATININE, CALCIUM,  in the last 72 hours PT/INR No results found for this basename: LABPROT, INR,  in the last 72 hours ABG No results found for this basename: PHART, PCO2, PO2, HCO3,  in the last 72 hours  Studies/Results: Ct Temporal Bones W/o Cm  07/05/2013   *RADIOLOGY REPORT*  Clinical Data: Ear infection.  Right facial weakness.  Tympanic membrane perforation on the right.  CT TEMPORAL BONES WITHOUT CONTRAST  Technique:  Axial and coronal plane CT imaging of the petrous temporal bones was performed with thin-collimation image reconstruction.  No intravenous contrast was administered. Multiplanar CT image reconstructions were also generated.  Comparison: None.  Findings: Normal right external ear.  Tympanostomy tube has been placed in the right tympanic membrane with slight retraction of the TM. There has been good drainage of the inferior right middle ear cavity.  The fluid which remains is more superomedial to the malleoincudal junction.   Right middle ear and mastoid fluid without signs of osseous destruction.  No fluid in Prussak's space.  Scutum preserved.  Ossicles intact.  No tegmen  mastoideum or tegmen tympani destruction.  No visible destruction of the osseous facial nerve canal on the right either in its pre-geniculate, horizontal, or vertical components.  Normal appearing inner ear structures within limits of evaluation on CT.  Normal left ear.  Normal visualized intracranial compartment. Mucosal thickening is present in the bilateral ethmoid and maxillary sinuses.  IMPRESSION: Right otitis media and mastoid fluid consistent with acute otitis and mastoiditis.  No evidence for cholesteatoma or osseous destruction.  Visualized facial nerve bony canal is intact.  Right tympanostomy tube good position, with no significant fluid in the lower portion of the right middle ear.   Original Report Authenticated By: Davonna Belling, M.D.    Anti-infectives: Anti-infectives   Start     Dose/Rate Route Frequency Ordered Stop   07/05/13 1515  piperacillin-tazobactam (ZOSYN) IVPB 3.375 g     3.375 g 12.5 mL/hr over 240 Minutes Intravenous 3 times per day 07/05/13 1511     07/05/13 1315  piperacillin-tazobactam (ZOSYN) IVPB 3.375 g     3.375 g 12.5 mL/hr over 240 Minutes Intravenous  Once 07/05/13 1305 07/05/13 1712      Assessment/Plan: s/p * No surgery found * Discharge  LOS: 2 days    Suzanna Obey 07/07/2013

## 2013-07-14 DIAGNOSIS — G519 Disorder of facial nerve, unspecified: Secondary | ICD-10-CM | POA: Insufficient documentation

## 2013-07-25 ENCOUNTER — Telehealth: Payer: Self-pay | Admitting: Obstetrics & Gynecology

## 2013-07-25 NOTE — Telephone Encounter (Signed)
Patient needs refill on patch . Fell off . Has a week left on Rx.

## 2013-07-26 ENCOUNTER — Encounter: Payer: Self-pay | Admitting: Obstetrics & Gynecology

## 2013-07-26 NOTE — Telephone Encounter (Signed)
Spoke to pt.  Patch fell off this weekend.  Pt has appt with Dr. Hyacinth Meeker tomorrow.  She will wait until then for refill.

## 2013-07-26 NOTE — Telephone Encounter (Signed)
I have attempted to contact this patient by phone with the following results: left message to return my call on answering machine (home/mobile).  

## 2013-07-26 NOTE — Telephone Encounter (Signed)
Pt returned call, but no one was on line when I picked up.  RC from pt.  No answer.  Message left to return call at her convenience.

## 2013-07-27 ENCOUNTER — Encounter: Payer: Self-pay | Admitting: Obstetrics & Gynecology

## 2013-07-27 ENCOUNTER — Ambulatory Visit (INDEPENDENT_AMBULATORY_CARE_PROVIDER_SITE_OTHER): Payer: BC Managed Care – PPO | Admitting: Obstetrics & Gynecology

## 2013-07-27 VITALS — BP 122/72 | HR 60 | Resp 12 | Ht 66.0 in | Wt 162.2 lb

## 2013-07-27 DIAGNOSIS — Z01419 Encounter for gynecological examination (general) (routine) without abnormal findings: Secondary | ICD-10-CM

## 2013-07-27 DIAGNOSIS — N952 Postmenopausal atrophic vaginitis: Secondary | ICD-10-CM

## 2013-07-27 DIAGNOSIS — Z0000 Encounter for general adult medical examination without abnormal findings: Secondary | ICD-10-CM

## 2013-07-27 LAB — POCT URINALYSIS DIPSTICK
Bilirubin, UA: NEGATIVE
Blood, UA: NEGATIVE
Glucose, UA: NEGATIVE
Ketones, UA: NEGATIVE
Leukocytes, UA: NEGATIVE
Nitrite, UA: NEGATIVE

## 2013-07-27 LAB — LIPID PANEL
Cholesterol: 221 mg/dL — ABNORMAL HIGH (ref 0–200)
HDL: 83 mg/dL (ref >39)
Triglycerides: 164 mg/dL — ABNORMAL HIGH (ref <150)
VLDL: 33 mg/dL (ref 0–40)

## 2013-07-27 LAB — COMPREHENSIVE METABOLIC PANEL
Albumin: 4.5 g/dL (ref 3.5–5.2)
BUN: 12 mg/dL (ref 6–23)
Calcium: 9.4 mg/dL (ref 8.4–10.5)
Chloride: 102 mEq/L (ref 96–112)
Creat: 0.89 mg/dL (ref 0.50–1.10)
Glucose, Bld: 90 mg/dL (ref 70–99)
Potassium: 4.6 mEq/L (ref 3.5–5.3)

## 2013-07-27 LAB — ESTRADIOL: Estradiol: 83.2 pg/mL

## 2013-07-27 MED ORDER — ESTRADIOL 0.05 MG/24HR TD PTWK: 1.0000 | MEDICATED_PATCH | TRANSDERMAL | Status: DC

## 2013-07-27 NOTE — Progress Notes (Signed)
42 y.o. G1P1 MarriedCaucasianF here for annual exam.  Doing well from HRT.  Had ear infection that she ended up on antibiotics and having tube in ear.  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  Now, she is so much better.   Having some sleep issue.  Taking Tylenol PM.    Patient's last menstrual period was 05/29/2009.          Sexually active: yes  The current method of family planning is status post hysterectomy.    Exercising: no  not regularly Smoker:  no  Health Maintenance: Pap:  05/22/09 WNL History of abnormal Pap:  no MMG:  10/13/12 normal, 12/17/12 lrft diag/us-screening in one year Colonoscopy:  none BMD:   none TDaP:  Up to date with work Screening Labs: today, Hb today: 12.7, Urine today: PH-7.0   reports that she has never smoked. She has never used smokeless tobacco. She reports that she does not drink alcohol or use illicit drugs.  Past Medical History  Diagnosis Date  . ADHD (attention deficit hyperactivity disorder)   . Anxiety   . Anemia     Past Surgical History  Procedure Laterality Date  . Laparoscopic hysterectomy  ~ 2009    TLH/LSO, cautery of endo  . Myringotomy with tube placement Right 07/04/2013    "serious effusion" (07/05/2013)    Current Outpatient Prescriptions  Medication Sig Dispense Refill  . Biotin 10 MG CAPS Take 1 tablet by mouth daily.      Marland Kitchen buPROPion (WELLBUTRIN SR) 200 MG 12 hr tablet Take 200 mg by mouth 2 (two) times daily.      . Calcium Carbonate-Vit D-Min (CALCIUM 1200 PO) Take 1 tablet by mouth 2 (two) times daily.      . diphenhydramine-acetaminophen (TYLENOL PM) 25-500 MG TABS Take 1 tablet by mouth at bedtime as needed.      Marland Kitchen estradiol (CLIMARA - DOSED IN MG/24 HR) 0.05 mg/24hr Place 1 patch (0.05 mg total) onto the skin once a week.  4 patch  1  . MAGNESIUM PO Take by mouth daily.      . methylphenidate (RITALIN) 20 MG tablet Take 20 mg by mouth 2 (two) times daily.      . Multiple Vitamins-Minerals (MULTIVITAMIN WITH MINERALS)  tablet Take 1 tablet by mouth daily.      Marland Kitchen ibuprofen (ADVIL,MOTRIN) 200 MG tablet Take 200-600 mg by mouth every 6 (six) hours as needed for pain.       No current facility-administered medications for this visit.    Family History  Problem Relation Age of Onset  . Diabetes Paternal Grandmother   . Diabetes Maternal Aunt   . Breast cancer Maternal Grandmother   . Colon cancer Maternal Grandfather   . Lung cancer Paternal Grandfather     smoker  . Hypertension Father   . Hypertension Mother   . Heart disease Father     deceased age 52  . Osteoporosis Mother   . Osteoporosis Maternal Grandmother     ROS:  Pertinent items are noted in HPI.  Otherwise, a comprehensive ROS was negative.  Exam:   BP 122/72  Pulse 60  Resp 12  Ht 5\' 6"  (1.676 m)  Wt 162 lb 3.2 oz (73.573 kg)  BMI 26.19 kg/m2  LMP 05/29/2009  Weight change: -5lbs   Height: 5\' 6"  (167.6 cm)  Ht Readings from Last 3 Encounters:  07/27/13 5\' 6"  (1.676 m)  07/05/13 5\' 4"  (1.626 m)    General appearance: alert,  cooperative and appears stated age Head: Normocephalic, without obvious abnormality, atraumatic Neck: no adenopathy, supple, symmetrical, trachea midline and thyroid normal to inspection and palpation Lungs: clear to auscultation bilaterally Breasts: normal appearance, no masses or tenderness Heart: regular rate and rhythm Abdomen: soft, non-tender; bowel sounds normal; no masses,  no organomegaly Extremities: extremities normal, atraumatic, no cyanosis or edema Skin: Skin color, texture, turgor normal. No rashes or lesions Lymph nodes: Cervical, supraclavicular, and axillary nodes normal. No abnormal inguinal nodes palpated Neurologic: Grossly normal   Pelvic: External genitalia:  no lesions              Urethra:  normal appearing urethra with no masses, tenderness or lesions              Bartholins and Skenes: normal                 Vagina: normal appearing vagina with normal color and discharge, no  lesions              Cervix: absent              Pap taken: no Bimanual Exam:  Uterus:  uterus absent              Adnexa: no mass, fullness, tenderness               Rectovaginal: Confirms               Anus:  normal sphincter tone, no lesions  A:  Well Woman with normal exam H/O TLH/LSO due to fibroids and endometriosis  P:   Mammogram yearly No pap smear done TSH, Vit D, CMP, Lipids, Estradiol Climara patch 0.05mg  weekly.  Rx to pharmacy for 3 months. return annually or prn  An After Visit Summary was printed and given to the patient.

## 2013-07-27 NOTE — Patient Instructions (Signed)

## 2013-09-30 ENCOUNTER — Telehealth: Payer: Self-pay | Admitting: Obstetrics & Gynecology

## 2013-09-30 DIAGNOSIS — R5381 Other malaise: Secondary | ICD-10-CM

## 2013-09-30 NOTE — Telephone Encounter (Signed)
Last exam 07/27/13, restarted on Climara. States that she is not sleeping well, having joint aches, and feels that symptoms have returned as when she stopping using Elestrin.  She would like to have her estradiol levels rechecked since starting the Climara and wondering if you would enter labs to check adrenal system.

## 2013-10-04 NOTE — Telephone Encounter (Signed)
Can come in for that and am cortisol level.

## 2013-10-04 NOTE — Telephone Encounter (Signed)
After labs done, will need OV 2-3 days or so.

## 2013-10-05 NOTE — Telephone Encounter (Signed)
Spoke with patient. Lab appointment and OV scheduled with Dr. Hyacinth Meeker.

## 2013-10-06 ENCOUNTER — Other Ambulatory Visit: Payer: BC Managed Care – PPO

## 2013-10-06 DIAGNOSIS — R5383 Other fatigue: Secondary | ICD-10-CM

## 2013-10-06 DIAGNOSIS — R5381 Other malaise: Secondary | ICD-10-CM

## 2013-10-07 LAB — ESTRADIOL: Estradiol: 25.2 pg/mL

## 2013-10-13 ENCOUNTER — Ambulatory Visit (INDEPENDENT_AMBULATORY_CARE_PROVIDER_SITE_OTHER): Payer: BC Managed Care – PPO | Admitting: Obstetrics & Gynecology

## 2013-10-13 VITALS — BP 122/68 | HR 64 | Resp 16 | Ht 66.0 in | Wt 165.4 lb

## 2013-10-13 DIAGNOSIS — N951 Menopausal and female climacteric states: Secondary | ICD-10-CM

## 2013-10-13 MED ORDER — ESTRADIOL 0.1 MG/24HR TD PTWK: 1.0000 | MEDICATED_PATCH | TRANSDERMAL | Status: DC

## 2013-10-13 MED ORDER — PROGESTERONE MICRONIZED 200 MG PO CAPS: 200.0000 mg | ORAL_CAPSULE | Freq: Every day | ORAL | Status: DC

## 2013-10-13 NOTE — Patient Instructions (Signed)
Please give me an update within a week.

## 2013-10-13 NOTE — Progress Notes (Signed)
42 y.o. Married Caucasian female G1P1 here for discussion of what she hopes are menopausal symptoms.  Patient having more hot flashes and night sweats.  She is really having trouble sleeping.  She does not feel like herself and feels that she is having emotional swings that aren't good for a mother trying to home school daughter and for marriage.  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  Recent blood work reviewed with pt.  Cortisol normal.  Estradiol low at 25.2.  Feel HRT dosage needs adjusting.  Pt in agreement.  O: Healthy WD,WN female Affect: normal  A:Menopausal symptoms  P: Prometrium 200mg  nightly.  Advised will made sleepy so needs to take at night.  Pt aware can also increase moodiness so she will watch carefully.    Increase Climara to 0.1mg  weekly.  Patches RX to pharmacy as well.  Pt to give update over next month.  May need f/u again.  ~15 minutes spent with patient >50% of time was in face to face discussion of above.

## 2013-10-18 ENCOUNTER — Encounter: Payer: Self-pay | Admitting: Obstetrics & Gynecology

## 2013-10-19 ENCOUNTER — Encounter: Payer: Self-pay | Admitting: Obstetrics & Gynecology

## 2013-10-19 MED ORDER — PROGESTERONE MICRONIZED 100 MG PO CAPS: ORAL_CAPSULE | ORAL | Status: DC

## 2013-11-03 ENCOUNTER — Other Ambulatory Visit: Payer: Self-pay

## 2013-11-28 ENCOUNTER — Telehealth: Payer: Self-pay | Admitting: Obstetrics & Gynecology

## 2013-11-28 DIAGNOSIS — N950 Postmenopausal bleeding: Secondary | ICD-10-CM

## 2013-11-28 NOTE — Telephone Encounter (Signed)
Patient thinks she id due for blood work due to hormones.

## 2013-11-29 MED ORDER — MEDROXYPROGESTERONE ACETATE 2.5 MG PO TABS: 2.5000 mg | ORAL_TABLET | Freq: Every day | ORAL | Status: DC

## 2013-11-29 NOTE — Telephone Encounter (Signed)
1)  Have her return for estradiol.  Order placed.   2)  I think most of her symptoms were estrogen related which is why they have improved with the increased climara patch dosage. 3)  Mood issues could be Prometrium.  Will change progesterones to Provera 2.5mg  daily.  Order placed for this.  She can switch now or when out of the Prometrium.

## 2013-11-29 NOTE — Telephone Encounter (Signed)
Spoke with patient. She has been taking 100 mg Progesterone. Feels that her symptoms have improved, but still feeling "emotional and getting sad out of the clear blue."  Some sleep issues as well.  She wanted to know if it was time to get her Estradiol or any other hormones rechecked?

## 2013-11-30 NOTE — Telephone Encounter (Signed)
Spoke with patient and message from Dr. Hyacinth Meeker given. She is agreeable to plan. Will have lab drawn. Appointment scheduled.

## 2013-12-01 ENCOUNTER — Other Ambulatory Visit (INDEPENDENT_AMBULATORY_CARE_PROVIDER_SITE_OTHER): Payer: BC Managed Care – PPO

## 2013-12-01 DIAGNOSIS — N950 Postmenopausal bleeding: Secondary | ICD-10-CM

## 2014-01-03 ENCOUNTER — Other Ambulatory Visit: Payer: Self-pay

## 2014-01-03 DIAGNOSIS — Z1231 Encounter for screening mammogram for malignant neoplasm of breast: Secondary | ICD-10-CM

## 2014-01-08 ENCOUNTER — Encounter: Payer: Self-pay | Admitting: Obstetrics & Gynecology

## 2014-01-24 ENCOUNTER — Ambulatory Visit
Admission: RE | Admit: 2014-01-24 | Discharge: 2014-01-24 | Disposition: A | Discharge status: Home / Self Care | Payer: BC Managed Care – PPO | Source: Ambulatory Visit

## 2014-01-24 DIAGNOSIS — Z1231 Encounter for screening mammogram for malignant neoplasm of breast: Secondary | ICD-10-CM

## 2014-01-26 ENCOUNTER — Other Ambulatory Visit: Payer: Self-pay | Admitting: Obstetrics & Gynecology

## 2014-01-26 DIAGNOSIS — R928 Other abnormal and inconclusive findings on diagnostic imaging of breast: Secondary | ICD-10-CM

## 2014-02-02 ENCOUNTER — Ambulatory Visit
Admission: RE | Admit: 2014-02-02 | Discharge: 2014-02-02 | Disposition: A | Discharge status: Home / Self Care | Payer: Self-pay | Source: Ambulatory Visit | Attending: Obstetrics & Gynecology | Admitting: Obstetrics & Gynecology

## 2014-02-02 ENCOUNTER — Other Ambulatory Visit: Payer: Self-pay | Admitting: Obstetrics & Gynecology

## 2014-02-02 DIAGNOSIS — R921 Mammographic calcification found on diagnostic imaging of breast: Secondary | ICD-10-CM

## 2014-02-02 DIAGNOSIS — R928 Other abnormal and inconclusive findings on diagnostic imaging of breast: Secondary | ICD-10-CM

## 2014-02-06 DIAGNOSIS — H7290 Unspecified perforation of tympanic membrane, unspecified ear: Secondary | ICD-10-CM | POA: Insufficient documentation

## 2014-02-06 DIAGNOSIS — H722X1 Other marginal perforations of tympanic membrane, right ear: Secondary | ICD-10-CM | POA: Insufficient documentation

## 2014-03-13 ENCOUNTER — Emergency Department (HOSPITAL_COMMUNITY)
Admission: EM | Admit: 2014-03-13 | Discharge: 2014-03-13 | Disposition: A | Discharge status: Home / Self Care | Payer: BC Managed Care – PPO | Source: Home / Self Care | Attending: Emergency Medicine | Admitting: Emergency Medicine

## 2014-03-13 ENCOUNTER — Encounter (HOSPITAL_COMMUNITY): Payer: Self-pay | Admitting: Emergency Medicine

## 2014-03-13 DIAGNOSIS — F410 Panic disorder [episodic paroxysmal anxiety] without agoraphobia: Secondary | ICD-10-CM

## 2014-03-13 LAB — TSH: TSH: 1.521 u[IU]/mL (ref 0.350–4.500)

## 2014-03-13 MED ORDER — LORAZEPAM 1 MG PO TABS: 1.0000 mg | ORAL_TABLET | Freq: Three times a day (TID) | ORAL | Status: DC

## 2014-03-13 MED ORDER — ESCITALOPRAM OXALATE 10 MG PO TABS: 10.0000 mg | ORAL_TABLET | Freq: Every day | ORAL | Status: DC

## 2014-03-13 NOTE — ED Notes (Addendum)
TSH: 1.521.  Dr. Lorenz CoasterKeller wants pt. notified of normal result. Cherly AndersonYork, Laetitia Schnepf M 03/13/2014 I called and left a message to call.  Call 1. Pt. called back a few minutes later and was notified. 03/14/2014

## 2014-03-13 NOTE — ED Provider Notes (Signed)
Chief Complaint   Chief Complaint  Patient presents with  . 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 These have occurred both at work and at home. There is no specific pattern and no obvious precipitating factors. The episodes begin with a sensation that her throat is closing up, dry mouth, her heart races, she feels tremulous, notes tingling of her hands and feet, and has difficulty breathing. She also notes rapid heartbeat, dizziness, and lightheadedness. She had one of these at the hospital yesterday and had an EKG which was normal. She had another episode today and the rapid response team was called. Her vital signs were stable, glucose was normal. She was given a Xanax with almost immediate relief of her symptoms. She's under the care of Dr. Derrek GuSloan Manning and is on Wellbutrin 450 mg per day for ADD. She denies any depression. She denies being under any stress or family or work related problems.  Review of Systems   Other than as noted above, the patient denies any of the following symptoms: Systemic:  No fever, chills, fatigue, weight loss or gain. Resp:  No shortness of breath. Cardiovasc:  No chest pain, palpitations, dizziness, or syncope. GI:  No abdominal pain, nausea, vomiting, anorexia, diarrhea, or constipation. Neuro:  No headache, paresthesias, or tremor. Psych:  No sadness, depression, crying, anxiety, panic, sleep disturbance, or suicidal or homicidal ideation.  No hallucinations or delusions.  PMFSH   Past medical history, family history, social history, meds, and allergies were reviewed.    Physical Examination     Vital signs:  BP 109/68  Pulse 89  Resp 16  SpO2 100%  LMP 05/29/2009 Gen:  Alert, oriented, in no distress. Lungs:  No respiratory distress.  Breath sounds clear and equal bilaterally.  No  wheezes, rales, or rhonchi. Heart:  Regular rthythm.  No gallops, murmers, clicks or rubs. Abdomen:  Soft, flat and nontender.  No organomegaly or mass. Neuro:  Alert and oriented times 3. Speech clear, fluent and appropriate.  Cranial nerves intact.  No focal weakness. Psych:  Mood and affect normal.  Speech pattern normal.  Thought content normal with no suicidal or homicidal ideation.  No paranoia, hallucinations, or delusions.  Memory, insight, and judgement normal.  Assessment   The encounter diagnosis was Panic attack.   Plan   1.  Meds:  The following meds were prescribed:   Discharge Medication List as of 03/13/2014  2:03 PM    START taking these medications   Details  escitalopram (LEXAPRO) 10 MG tablet Take 1 tablet (10 mg total) by mouth daily., Starting 03/13/2014, Until Discontinued, Normal    LORazepam (ATIVAN) 1 MG tablet Take 1 tablet (1 mg total) by mouth every 8 (eight) hours., Starting 03/13/2014, Until Discontinued, Print        2.  Patient Education/Counseling:  The patient was given appropriate handouts, self care instructions, and instructed in symptomatic relief.  Suggested she cut back her Wellbutrin to 300 mg per day and add Lexapro. May take the Ativan as needed. Followup with Dr. Kathrynn RunningManning within the next week.  3.  Follow up:  The patient was told to follow up if no better in 3 to 4 days, if becoming worse in any way, and given some red flag symptoms such as worsening symptoms or suicidal ideation which would  prompt immediate return.       Reuben Likes, MD 03/13/14 [phone]

## 2014-03-13 NOTE — ED Notes (Signed)
Pt reports having a period of an hour and a half of feeling like she was not able to breath, shaking,  Dry mouth, weakness, and not able to stand.   Incident happened around 10 a.m this morning.    Pt states that she has been feeling this was for the past two weeks but today symptoms were worse.   States that she has been having episodes at night also that wake her from her sleep.   No hx of anxiety/pandic disorder.    Pt was given 1/2 mg of xanax from another [employer] worker, which has helped some.

## 2014-03-13 NOTE — Discharge Instructions (Signed)
Decrease Wellbutrin to 300 mg daily.   Panic Attacks Panic attacks are sudden, short-livedsurges of severe anxiety, fear, or discomfort. They may occur for no reason when you are relaxed, when you are anxious, or when you are sleeping. Panic attacks may occur for a number of reasons:   Healthy people occasionally have panic attacks in extreme, life-threatening situations, such as war or natural disasters. Normal anxiety is a protective mechanism of the body that helps us react to danger (fight or flight response).  Panic attacks are often seen with anxiety disorders, such as panic disorder, social anxiety disorder, generalized anxiety disorder, and phobias. Anxiety disorders cause excessive or uncontrollable anxiety. They may interfere with your relationships or other life activities.  Panic attacks are sometimes seen with other mental illnesses such as depression and posttraumatic stress disorder.  Certain medical conditions, prescription medicines, and drugs of abuse can cause panic attacks. SYMPTOMS  Panic attacks start suddenly, peak within 20 minutes, and are accompanied by four or more of the following symptoms:  Pounding heart or fast heart rate (palpitations).  Sweating.  Trembling or shaking.  Shortness of breath or feeling smothered.  Feeling choked.  Chest pain or discomfort.  Nausea or strange feeling in your stomach.  Dizziness, lightheadedness, or feeling like you will faint.  Chills or hot flushes.  Numbness or tingling in your lips or hands and feet.  Feeling that things are not real or feeling that you are not yourself.  Fear of losing control or going crazy.  Fear of dying. Some of these symptoms can mimic serious medical conditions. For example, you may think you are having a heart attack. Although panic attacks can be very scary, they are not life threatening. DIAGNOSIS  Panic attacks are diagnosed through an assessment by your health care provider.  Your health care provider will ask questions about your symptoms, such as where and when they occurred. Your health care provider will also ask about your medical history and use of alcohol and drugs, including prescription medicines. Your health care provider may order blood tests or other studies to rule out a serious medical condition. Your health care provider may refer you to a mental health professional for further evaluation. TREATMENT   Most healthy people who have one or two panic attacks in an extreme, life-threatening situation will not require treatment.  The treatment for panic attacks associated with anxiety disorders or other mental illness typically involves counseling with a mental health professional, medicine, or a combination of both. Your health care provider will help determine what treatment is best for you.  Panic attacks due to physical illness usually goes away with treatment of the illness. If prescription medicine is causing panic attacks, talk with your health care provider about stopping the medicine, decreasing the dose, or substituting another medicine.  Panic attacks due to alcohol or drug abuse goes away with abstinence. Some adults need professional help in order to stop drinking or using drugs. HOME CARE INSTRUCTIONS   Take all your medicines as prescribed.   Check with your health care provider before starting new prescription or over-the-counter medicines.  Keep all follow up appointments with your health care provider. SEEK MEDICAL CARE IF:  You are not able to take your medicines as prescribed.  Your symptoms do not improve or get worse. SEEK IMMEDIATE MEDICAL CARE IF:   You experience panic attack symptoms that are different than your usual symptoms.  You have serious thoughts about hurting yourself or others.  You are taking medicine for panic attacks and have a serious side effect. MAKE SURE YOU:  Understand these instructions.  Will watch  your condition.  Will get help right away if you are not doing well or get worse. Document Released: 12/15/2005 Document Revised: 10/05/2013 Document Reviewed: 07/29/2013 [facility] Patient Information 2014 [place], [place].

## 2014-03-14 ENCOUNTER — Encounter: Payer: Self-pay | Admitting: Obstetrics & Gynecology

## 2014-03-15 ENCOUNTER — Other Ambulatory Visit: Payer: Self-pay | Admitting: *Deleted

## 2014-03-15 ENCOUNTER — Ambulatory Visit (INDEPENDENT_AMBULATORY_CARE_PROVIDER_SITE_OTHER): Payer: BC Managed Care – PPO | Admitting: Obstetrics & Gynecology

## 2014-03-15 DIAGNOSIS — F411 Generalized anxiety disorder: Secondary | ICD-10-CM

## 2014-03-16 ENCOUNTER — Other Ambulatory Visit: Payer: Self-pay | Admitting: Obstetrics & Gynecology

## 2014-03-16 DIAGNOSIS — N951 Menopausal and female climacteric states: Secondary | ICD-10-CM

## 2014-03-16 LAB — TSH: TSH: 1.274 u[IU]/mL (ref 0.350–4.500)

## 2014-03-16 LAB — ESTRADIOL, FREE

## 2014-03-17 LAB — ESTRADIOL: Estradiol: 124.9 pg/mL

## 2014-03-17 LAB — PROGESTERONE: Progesterone: 10 ng/mL

## 2014-05-31 ENCOUNTER — Encounter: Payer: Self-pay | Admitting: Obstetrics & Gynecology

## 2014-06-06 ENCOUNTER — Telehealth: Payer: Self-pay | Admitting: Emergency Medicine

## 2014-06-06 NOTE — Telephone Encounter (Signed)
Called patient to schedule office visit with Dr. Hyacinth Meeker to discuss medications. She is agreeable. Appointment scheduled for 06/15/14 at 0830  Routing to provider for final review. Patient agreeable to disposition. Will close encounter

## 2014-06-15 ENCOUNTER — Telehealth: Payer: Self-pay | Admitting: Obstetrics & Gynecology

## 2014-06-15 ENCOUNTER — Encounter: Payer: Self-pay | Admitting: Obstetrics & Gynecology

## 2014-06-15 ENCOUNTER — Ambulatory Visit (INDEPENDENT_AMBULATORY_CARE_PROVIDER_SITE_OTHER): Payer: BC Managed Care – PPO | Admitting: Obstetrics & Gynecology

## 2014-06-15 VITALS — BP 110/70 | HR 68 | Ht 67.0 in | Wt 171.0 lb

## 2014-06-15 DIAGNOSIS — N951 Menopausal and female climacteric states: Secondary | ICD-10-CM

## 2014-06-15 MED ORDER — PROGESTERONE MICRONIZED 100 MG PO CAPS: 100.0000 mg | ORAL_CAPSULE | Freq: Every day | ORAL | Status: DC

## 2014-06-15 MED ORDER — ESTRADIOL 1 MG PO TABS: 1.0000 mg | ORAL_TABLET | Freq: Two times a day (BID) | ORAL | Status: DC

## 2014-06-15 MED ORDER — LORAZEPAM 1 MG PO TABS: 1.0000 mg | ORAL_TABLET | Freq: Three times a day (TID) | ORAL | Status: DC

## 2014-06-15 NOTE — Progress Notes (Addendum)
Patient ID: [name] F Boeve, female   DOB: 03/15/1971, 43 y.o.   MRN: [id number]  43 y.o. Married Caucasian female G1P1 here for discussion of hormonal symptoms.  Patient has been on Climara patch 0.1mg  weekly and Prometirum 200mg  nightly.  Reports in March, started having "episodes" at night where she was waking up and having extreme thirst.  Had an "episode" at work that was more severe.  Rapid response was called.  Blood sugar was fine.  Vitals are all sign.  Pt felt like she was "dying".  Evaluation was completely normal.  Somebody at work gave her a Xanax and this resolved all of the symptoms.    Saw PCP.  He suggested she decrease her progesterone dosage.  Prozac started and rx given for Ativan.  She doesn't feel like the Prozac did anything for her.  Had some lab work here.  Estradiol and Progesterone levels were appropriate.  Pt started taking half of the Progesterone.  The anxiety completely resolved.  She then tried taking none as she had a hysterectomy and knows that she doesn't "need" the progesterone.  She also cut the patch in 1/2 and she couldn't sleep at all.     O: Healthy WD,WN female  Affect: normal   A: Menopausal symptoms   P: Stop Climara.  Switch to estradiol 1mg  bid.  Pt will see over the next month if this helps.  She will stay on the same Prometrium dosage.  May end up using 50mg  but I'm only going to change one hormone at a time.  Pt in agreement with this and will communicate with me via MyChart regarding symptoms.  ~15 minutes spent with patient >50% of time was in face to face discussion of above.

## 2014-06-15 NOTE — Telephone Encounter (Signed)
Left a detailed message at number provided [phone]. Okay per ROI. Advised patient that rx for estradiol 1mg  BID was sent in to pharmacy of choice. Advised to call back if any further needs or questions.  Dr.Milleer, RX for Estradiol 1mg  BID #180 with 4RF sent in to patient's pharmacy per office visit notes.  Routing to provider for final review. Patient agreeable to disposition. Will close encounter.

## 2014-06-15 NOTE — Telephone Encounter (Signed)
Patient was seen by Dr. Hyacinth MeekerMiller this morning and called in from the pharmacy stating an RX for Estradiol was not called into the pharmacy. A clinical staff member confirmed it was not called in. Please call in RX to [facility] in [place] as soon as possible.

## 2014-06-29 ENCOUNTER — Encounter: Payer: Self-pay | Admitting: Obstetrics & Gynecology

## 2014-07-04 MED ORDER — NONFORMULARY OR COMPOUNDED ITEM: Status: DC

## 2014-07-04 NOTE — Telephone Encounter (Signed)
Will have pt use compounded progesterone cream 50mg /ml.  1-772ml to inner thigh.  Message sent to pt through MyChart--her preferred means of communication.  Rx called to [facility].

## 2014-07-05 ENCOUNTER — Telehealth: Payer: Self-pay | Admitting: Obstetrics & Gynecology

## 2014-07-05 NOTE — Telephone Encounter (Signed)
Ed Buzzy HanMeade calling from [facility] is calling to verify a prescription that was sent there on 07/04/14. He is not sure what the prescription is.

## 2014-07-05 NOTE — Telephone Encounter (Signed)
Mr. [name] was just calling to verify strength of the progesterone cream confirmed that it was  50 mg/ml  Just wanted to make sure before he compounded the progesterone, pharmacy aware.

## 2014-07-06 NOTE — Telephone Encounter (Signed)
Yes, that is correct 50mg /ml.  Please let them know.  Thanks.

## 2014-07-06 NOTE — Telephone Encounter (Signed)
Reggy EyeJasmine Shaw informed pharmacy yesterday of strength of rx  Encounter closed

## 2014-07-31 ENCOUNTER — Encounter: Payer: Self-pay | Admitting: Obstetrics & Gynecology

## 2014-08-07 ENCOUNTER — Telehealth: Payer: Self-pay | Admitting: Emergency Medicine

## 2014-08-07 NOTE — Telephone Encounter (Signed)
Message copied by Joeseph AmorFAST, Dayzee Trower L on Mon Aug 07, 2014  1:52 PM ------      Message from: Jerene BearsMILLER, MARY S      Created: Mon Aug 07, 2014  8:40 AM      Regarding: dr referral for pt       Pt asked me to refer her to Dr. Vincente PoliGrewal for HRT management.  I spoke to Dr. Vincente PoliGrewal personally today and she is not (and has not been) accepting new gyn patients for several years.  Please let her know I am sorry.  She could consider seeing Tammy Worrell or [facility] in [place] who uses a lot of bio-identicals.  She will not need a referral to either.            Dr. Hyacinth MeekerMiller ------

## 2014-08-07 NOTE — Telephone Encounter (Signed)
Message left to return call to [facility] at [phone]. Detailed message left to advise patient of message from Dr. Hyacinth MeekerMiller and to call back with any further questions. Okay for detailed message per designated party release form.

## 2014-10-04 ENCOUNTER — Other Ambulatory Visit: Payer: Self-pay | Admitting: Obstetrics & Gynecology

## 2014-10-04 DIAGNOSIS — R921 Mammographic calcification found on diagnostic imaging of breast: Secondary | ICD-10-CM

## 2014-10-12 ENCOUNTER — Ambulatory Visit: Payer: BC Managed Care – PPO | Admitting: Obstetrics & Gynecology

## 2014-10-17 ENCOUNTER — Ambulatory Visit
Admission: RE | Admit: 2014-10-17 | Discharge: 2014-10-17 | Disposition: A | Discharge status: Home / Self Care | Payer: BC Managed Care – PPO | Source: Ambulatory Visit | Attending: Obstetrics & Gynecology | Admitting: Obstetrics & Gynecology

## 2014-10-17 ENCOUNTER — Other Ambulatory Visit: Payer: Self-pay | Admitting: Obstetrics & Gynecology

## 2014-10-17 DIAGNOSIS — R921 Mammographic calcification found on diagnostic imaging of breast: Secondary | ICD-10-CM

## 2014-10-26 DIAGNOSIS — F411 Generalized anxiety disorder: Secondary | ICD-10-CM | POA: Insufficient documentation

## 2014-10-26 DIAGNOSIS — N951 Menopausal and female climacteric states: Secondary | ICD-10-CM | POA: Insufficient documentation

## 2014-10-30 ENCOUNTER — Encounter: Payer: Self-pay | Admitting: Obstetrics & Gynecology

## 2015-03-09 ENCOUNTER — Other Ambulatory Visit: Payer: Self-pay | Admitting: Obstetrics & Gynecology

## 2015-03-09 DIAGNOSIS — R921 Mammographic calcification found on diagnostic imaging of breast: Secondary | ICD-10-CM

## 2015-03-21 ENCOUNTER — Ambulatory Visit
Admission: RE | Admit: 2015-03-21 | Discharge: 2015-03-21 | Disposition: A | Discharge status: Home / Self Care | Payer: BC Managed Care – PPO | Source: Ambulatory Visit | Attending: Obstetrics & Gynecology | Admitting: Obstetrics & Gynecology

## 2015-03-21 DIAGNOSIS — R921 Mammographic calcification found on diagnostic imaging of breast: Secondary | ICD-10-CM

## 2016-04-29 ENCOUNTER — Other Ambulatory Visit: Payer: Self-pay | Admitting: Obstetrics & Gynecology

## 2016-04-29 DIAGNOSIS — R921 Mammographic calcification found on diagnostic imaging of breast: Secondary | ICD-10-CM

## 2016-05-08 ENCOUNTER — Ambulatory Visit
Admission: RE | Admit: 2016-05-08 | Discharge: 2016-05-08 | Disposition: A | Discharge status: Home / Self Care | Payer: BC Managed Care – PPO | Source: Ambulatory Visit | Attending: Obstetrics & Gynecology | Admitting: Obstetrics & Gynecology

## 2016-05-08 DIAGNOSIS — R921 Mammographic calcification found on diagnostic imaging of breast: Secondary | ICD-10-CM

## 2016-08-31 ENCOUNTER — Encounter (HOSPITAL_COMMUNITY): Payer: Self-pay | Admitting: Emergency Medicine

## 2016-08-31 ENCOUNTER — Ambulatory Visit (HOSPITAL_COMMUNITY)
Admission: EM | Admit: 2016-08-31 | Discharge: 2016-08-31 | Disposition: A | Discharge status: Home / Self Care | Payer: BC Managed Care – PPO | Attending: Physician Assistant | Admitting: Physician Assistant

## 2016-08-31 DIAGNOSIS — H8111 Benign paroxysmal vertigo, right ear: Secondary | ICD-10-CM

## 2016-08-31 MED ORDER — ONDANSETRON 4 MG PO TBDP
ORAL_TABLET | ORAL | Status: AC
  Filled 2016-08-31: qty 1

## 2016-08-31 MED ORDER — MECLIZINE HCL 12.5 MG PO TABS: 12.5000 mg | ORAL_TABLET | Freq: Three times a day (TID) | ORAL | 0 refills | Status: DC | PRN

## 2016-08-31 MED ORDER — ONDANSETRON HCL 4 MG PO TABS: 4.0000 mg | ORAL_TABLET | Freq: Four times a day (QID) | ORAL | 0 refills | Status: DC

## 2016-08-31 NOTE — ED Triage Notes (Signed)
The [facility] with a complaint of a sudden onset of dizziness that started at her church today that was followed by N/V/D. The patient stated that she did take 0.25mg  of Lorazepam at 1pm to attempt to help.

## 2017-03-12 DIAGNOSIS — H900 Conductive hearing loss, bilateral: Secondary | ICD-10-CM | POA: Insufficient documentation

## 2017-03-12 DIAGNOSIS — J3089 Other allergic rhinitis: Secondary | ICD-10-CM | POA: Insufficient documentation

## 2017-03-12 DIAGNOSIS — H7011 Chronic mastoiditis, right ear: Secondary | ICD-10-CM | POA: Insufficient documentation

## 2017-03-26 DIAGNOSIS — E6609 Other obesity due to excess calories: Secondary | ICD-10-CM | POA: Insufficient documentation

## 2017-03-26 DIAGNOSIS — B351 Tinea unguium: Secondary | ICD-10-CM | POA: Insufficient documentation

## 2017-04-13 ENCOUNTER — Other Ambulatory Visit: Payer: Self-pay | Admitting: Otolaryngology

## 2017-04-13 DIAGNOSIS — H722X1 Other marginal perforations of tympanic membrane, right ear: Secondary | ICD-10-CM

## 2017-04-13 DIAGNOSIS — H900 Conductive hearing loss, bilateral: Secondary | ICD-10-CM

## 2017-04-30 ENCOUNTER — Ambulatory Visit
Admission: RE | Admit: 2017-04-30 | Discharge: 2017-04-30 | Disposition: A | Discharge status: Home / Self Care | Payer: BC Managed Care – PPO | Source: Ambulatory Visit | Attending: Otolaryngology | Admitting: Otolaryngology

## 2017-04-30 DIAGNOSIS — H722X1 Other marginal perforations of tympanic membrane, right ear: Secondary | ICD-10-CM

## 2017-04-30 DIAGNOSIS — H900 Conductive hearing loss, bilateral: Secondary | ICD-10-CM

## 2017-06-08 DIAGNOSIS — J324 Chronic pansinusitis: Secondary | ICD-10-CM | POA: Insufficient documentation

## 2017-08-10 ENCOUNTER — Other Ambulatory Visit: Payer: Self-pay | Admitting: Obstetrics & Gynecology

## 2017-08-10 ENCOUNTER — Telehealth: Payer: BC Managed Care – PPO | Admitting: Nurse Practitioner

## 2017-08-10 DIAGNOSIS — J0100 Acute maxillary sinusitis, unspecified: Secondary | ICD-10-CM

## 2017-08-10 DIAGNOSIS — Z1231 Encounter for screening mammogram for malignant neoplasm of breast: Secondary | ICD-10-CM

## 2017-08-10 MED ORDER — AMOXICILLIN-POT CLAVULANATE 875-125 MG PO TABS: 1.0000 | ORAL_TABLET | Freq: Two times a day (BID) | ORAL | 0 refills | Status: DC

## 2017-08-10 MED FILL — AMOX-CLAV 875-125 MG TABLET: 875-125 | 7 days supply | Qty: 14 | Fill #0

## 2017-08-10 NOTE — Progress Notes (Signed)

## 2017-08-20 ENCOUNTER — Ambulatory Visit
Admission: RE | Admit: 2017-08-20 | Discharge: 2017-08-20 | Disposition: A | Discharge status: Home / Self Care | Payer: BC Managed Care – PPO | Source: Ambulatory Visit | Attending: Obstetrics & Gynecology | Admitting: Obstetrics & Gynecology

## 2017-08-20 DIAGNOSIS — Z1231 Encounter for screening mammogram for malignant neoplasm of breast: Secondary | ICD-10-CM

## 2017-08-31 ENCOUNTER — Telehealth: Payer: BC Managed Care – PPO | Admitting: Family

## 2017-08-31 DIAGNOSIS — H60331 Swimmer's ear, right ear: Secondary | ICD-10-CM

## 2017-08-31 MED ORDER — NEOMYCIN-POLYMYXIN-HC 3.5-10000-1 OT SOLN: 4.0000 [drp] | Freq: Four times a day (QID) | OTIC | 0 refills | Status: DC

## 2017-08-31 NOTE — Progress Notes (Signed)
E Visit for Swimmer's Ear  We are sorry that you are not feeling well. Here is how we plan to help!  Based on what you have shared with me it looks like you have swimmers ear. Swimmer's ear is a redness or swelling, irritation, or infection of your outer ear canal.  These symptoms usually occur within a few days of swimming.  Your ear canal is a tube that goes from the opening of the ear to the eardrum.  When water stays in your ear canal, germs can grow.  This is a painful condition that often happens to children and swimmers of all ages.  It is not contagious and oral antibiotics are not required to treat uncomplicated swimmer's ear.  The usual symptoms include: Itching inside the ear, Redness or a sense of swelling in the ear, Pain when the ear is tugged on when pressure is placed on the ear, Pus draining from the infected ear. and I have prescribed: Neomycin 0.35%, polymyxin B 10,000 units/mL, and hydrocortisone 0,5% otic solution 4 drops in affected ears four times a day until completed    In certain cases swimmer's ear may progress to a more serious bacterial infection of the middle or inner ear.  If you have a fever 102 and up and significantly worsening symptoms, this could indicate a more serious infection moving to the middle/inner and needs face to face evaluation in an office by a provider.  Your symptoms should improve over the next 3 days and should resolve in about 7 days.  HOME CARE:   Wash your hands frequently.  Do not place the tip of the bottle on your ear or touch it with your fingers.  You can take Acetominophen 650 mg every 4-6 hours as needed for pain.  If pain is severe or moderate, you can apply a heating pad (set on low) or hot water bottle (wrapped in a towel) to outer ear for 20 minutes.  This will also increase drainage.  Avoid ear plugs  Do not use Q-tips  After showers, help the water run out by tilting your head to one side.  GET HELP RIGHT AWAY  IF:   Fever is over 102.2 degrees.  You develop progressive ear pain or hearing loss.  Ear symptoms persist longer than 3 days after treatment.  MAKE SURE YOU:   Understand these instructions.  Will watch your condition.  Will get help right away if you are not doing well or get worse.  TO PREVENT SWIMMER'S EAR:  Use a bathing cap or custom fitted swim molds to keep your ears dry.  Towel off after swimming to dry your ears.  Tilt your head or pull your earlobes to allow the water to escape your ear canal.  If there is still water in your ears, consider using a hairdryer on the lowest setting.  Thank you for choosing an e-visit. Your e-visit answers were reviewed by a board certified advanced clinical practitioner to complete your personal care plan. Depending upon the condition, your plan could have included both over the counter or prescription medications. Please review your pharmacy choice. Be sure that the pharmacy you have chosen is open so that you can pick up your prescription now.  If there is a problem you may message your provider in MyChart to have the prescription routed to another pharmacy. Your safety is important to us. If you have drug allergies check your prescription carefully.  For the next 24 hours, you can use   MyChart to ask questions about today's visit, request a non-urgent call back, or ask for a work or school excuse from your e-visit provider. You will get an email in the next two days asking about your experience. I hope that your e-visit has been valuable and will speed your recovery.       

## 2018-04-06 ENCOUNTER — Other Ambulatory Visit: Payer: Self-pay

## 2018-04-06 ENCOUNTER — Encounter (HOSPITAL_COMMUNITY): Payer: Self-pay | Admitting: Emergency Medicine

## 2018-04-06 ENCOUNTER — Ambulatory Visit (INDEPENDENT_AMBULATORY_CARE_PROVIDER_SITE_OTHER): Payer: BC Managed Care – PPO

## 2018-04-06 ENCOUNTER — Ambulatory Visit (HOSPITAL_COMMUNITY)
Admission: EM | Admit: 2018-04-06 | Discharge: 2018-04-06 | Disposition: A | Discharge status: Home / Self Care | Payer: BC Managed Care – PPO | Attending: Family Medicine | Admitting: Family Medicine

## 2018-04-06 DIAGNOSIS — R079 Chest pain, unspecified: Secondary | ICD-10-CM | POA: Diagnosis not present

## 2018-04-06 NOTE — Discharge Instructions (Addendum)
Angina Pectoris Angina pectoris is a very bad feeling in the chest, neck, or arm. Your doctor may call it angina. There are four types of angina. Angina is caused by a lack of blood in the middle and thickest layer of the heart wall (myocardium). Angina may feel like a crushing or squeezing pain in the chest. It may feel like tightness or heavy pressure in the chest. Some people say it feels like gas, heartburn, or indigestion. Some people have symptoms other than pain. These include:  Shortness of breath.  Cold sweats.  Feeling sick to your stomach (nausea).  Feeling light-headed.  Many women have chest discomfort and some of the other symptoms. However, women often have different symptoms, such as:  Feeling tired (fatigue).  Feeling nervous for no reason.  Feeling weak for no reason.  Dizziness or fainting.  Women may have angina without any symptoms. Follow these instructions at home:  Take medicines only as told by your doctor.  Take care of other health issues as told by your doctor. These include: ? High blood pressure (hypertension). ? Diabetes.  Follow a heart-healthy diet. Your doctor can help you to choose healthy food options and make changes.  Talk to your doctor to learn more about healthy cooking methods and use them. These include: ? Roasting. ? Grilling. ? Broiling. ? Baking. ? Poaching. ? Steaming. ? Stir-frying.  Follow an exercise program approved by your doctor.  Keep a healthy weight. Lose weight as told by your doctor.  Rest when you are tired.  Learn to manage stress.  Do not use any tobacco, such as cigarettes, chewing tobacco, or electronic cigarettes. If you need help quitting, ask your doctor.  If you drink alcohol, and your doctor says it is okay, limit yourself to no more than 1 drink per day. One drink equals 12 ounces of beer, 5 ounces of wine, or 1 ounces of hard liquor.  Stop illegal drug use.  Keep all follow-up visits as told  by your doctor. This is important. Do not take these medicines unless your doctor says that you can:  Nonsteroidal anti-inflammatory drugs (NSAIDs). These include: ? Ibuprofen. ? Naproxen. ? Celecoxib.  Vitamin supplements that have vitamin A, vitamin E, or both.  Hormone therapy that contains estrogen with or without progestin.  Get help right away if:  You have pain in your chest, neck, arm, jaw, stomach, or back that: ? Lasts more than a few minutes. ? Comes back. ? Does not get better after you take medicine under your tongue (sublingual nitroglycerin).  You have any of these symptoms for no reason: ? Gas, heartburn, or indigestion. ? Sweating a lot. ? Shortness of breath or trouble breathing. ? Feeling sick to your stomach or throwing up. ? Feeling more tired than usual. ? Feeling nervous or worrying more than usual. ? Feeling weak. ? Diarrhea.  You are suddenly dizzy or light-headed.  You faint or pass out. These symptoms may be an emergency. Do not wait to see if the symptoms will go away. Get medical help right away. Call your local emergency services (911 in the [place] Do not drive yourself to the hospital. This information is not intended to replace advice given to you by your health care provider. Make sure you discuss any questions you have with your health care provider. Document Released: 06/02/2008 Document Revised: 05/22/2016 Document Reviewed: 04/18/2014 Elsevier Interactive Patient Education  2017 [employer]vier Inc.  

## 2018-04-06 NOTE — ED Triage Notes (Signed)
C/o left side chest discomfort "shooting" on occasion x 2 weeks

## 2018-04-06 NOTE — ED Provider Notes (Signed)
[facility]    CSN: [id number] Arrival date & time: 04/06/18  1758     History   Chief Complaint Chief Complaint  Patient presents with  . Chest Pain    HPI [name] is a 47 y.o. female.   Patient has had intermittent left-sided chest pain off and on for 2 weeks.  It is described as sharp and shooting rather than pressure.  It is not related to exertion or activities.  There is been no cough or other respiratory symptom.  HPI  Past Medical History:  Diagnosis Date  . ADHD (attention deficit hyperactivity disorder)   . Anemia   . Anxiety   . 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 ADD (attention deficit disorder) 08/18/2011  . Clinical depression 08/18/2011    Past Surgical History:  Procedure Laterality Date  . ABDOMINAL HYSTERECTOMY    . LAPAROSCOPIC HYSTERECTOMY  ~ 2009   TLH/LSO, cautery of endo  . MYRINGOTOMY WITH TUBE PLACEMENT Right 07/04/2013   "serious effusion" (07/05/2013)    OB History    Gravida  1   Para  1   Term      Preterm      AB      Living  1     SAB      TAB      Ectopic      Multiple      Live Births               Home Medications    Prior to Admission medications   Medication Sig Start Date End Date Taking? Authorizing Provider  amoxicillin-clavulanate (AUGMENTIN) 875-125 MG tablet Take 1 tablet by mouth 2 (two) times daily. 08/10/17   Worthy RancherWebb, Padonda B, FNP  Biotin 10 MG CAPS Take 1 tablet by mouth daily.    [provider]  buPROPion (WELLBUTRIN SR) 200 MG 12 hr tablet Take 200 mg by mouth 2 (two) times daily.    [provider]  Calcium Carbonate-Vit D-Min (CALCIUM 1200 PO) Take 1 tablet by mouth 2 (two) times daily.    [provider]  estradiol (ESTRACE) 1 MG tablet Take 1 tablet (1 mg total) by mouth 2 (two) times daily. 06/15/14    Jerene BearsMiller, Mary S, MD  FLUoxetine (PROZAC) 10 MG capsule  04/06/14   [provider]  ibuprofen (ADVIL,MOTRIN) 200 MG tablet Take 200-600 mg by mouth every 6 (six) hours as needed for pain.    [provider]  LORazepam (ATIVAN) 1 MG tablet Take 1 tablet (1 mg total) by mouth every 8 (eight) hours. 06/15/14   Jerene BearsMiller, Mary S, MD  MAGNESIUM PO Take by mouth daily.    [provider]  meclizine (ANTIVERT) 12.5 MG tablet Take 1 tablet (12.5 mg total) by mouth 3 (three) times daily as needed for dizziness. 08/31/16   Tharon AquasPatrick, Frank C, PA  methylphenidate (RITALIN) 20 MG tablet Take 20 mg by mouth 2 (two) times daily.    [provider]  Multiple Vitamins-Minerals (MULTIVITAMIN WITH MINERALS) tablet Take 1 tablet by mouth daily.    [provider]  neomycin-polymyxin-hydrocortisone (CORTISPORIN) OTIC solution Place 4 drops into the right ear 4 (four) times daily. 08/31/17   Junie SpencerHawks, Christy A, FNP  NONFORMULARY OR COMPOUNDED ITEM Progesterone cream 50mg /ml.  Use 1-782ml nightly.  Apply  to inner thigh.  Disp:  60ml 07/04/14   Jerene Bears, MD  ondansetron (ZOFRAN) 4 MG tablet Take 1 tablet (4 mg total) by mouth every 6 (six) hours. 08/31/16   Tharon Aquas, PA  progesterone (PROMETRIUM) 100 MG capsule Take 1 capsule (100 mg total) by mouth daily. 06/15/14   Jerene Bears, MD    Family History Family History  Problem Relation Age of Onset  . Diabetes Paternal Grandmother   . Diabetes Maternal Aunt   . Breast cancer Maternal Grandmother   . Osteoporosis Maternal Grandmother   . Colon cancer Maternal Grandfather   . Lung cancer Paternal Grandfather        smoker  . Hypertension Father   . Heart disease Father        deceased age 101  . Hypertension Mother   . Osteoporosis Mother     Social History Social History   Tobacco Use  . Smoking status: Never Smoker  . Smokeless tobacco: Never Used  Substance Use Topics  . Alcohol use: No  . Drug use: No      Allergies   Patient has no known allergies.   Review of Systems Review of Systems  Constitutional: Negative.   HENT: Negative.   Respiratory: Negative.   Cardiovascular: Positive for chest pain.  Gastrointestinal: Negative.      Physical Exam Triage Vital Signs ED Triage Vitals [04/06/18 1844]  Enc Vitals Group     BP 121/78     Pulse Rate 74     Resp      Temp 98.3 F (36.8 C)     Temp Source Oral     SpO2 100 %     Weight      Height      Head Circumference      Peak Flow      Pain Score      Pain Loc      Pain Edu?      Excl. in GC?    No data found.  Updated Vital Signs BP 121/78 (BP Location: Left Arm)   Pulse 74   Temp 98.3 F (36.8 C) (Oral)   LMP 05/29/2009   SpO2 100%   Visual Acuity Right Eye Distance:   Left Eye Distance:   Bilateral Distance:    Right Eye Near:   Left Eye Near:    Bilateral Near:     Physical Exam  Constitutional: She appears well-developed and well-nourished.  HENT:  Head: Normocephalic.  Cardiovascular: Normal rate, regular rhythm and normal pulses.  Pulmonary/Chest: Effort normal and breath sounds normal.     UC Treatments / Results  Labs (all labs ordered are listed, but only abnormal results are displayed) Labs Reviewed - No data to display  EKG None Radiology Dg Chest 2 View  Result Date: 04/06/2018 CLINICAL DATA:  Intermittent chest pain for 1 week. EXAM: CHEST - 2 VIEW COMPARISON:  None. FINDINGS: The cardiomediastinal silhouette is within normal limits. The lungs are well inflated and clear. There is no evidence of pleural effusion or pneumothorax. No acute osseous abnormality is identified. IMPRESSION: No active cardiopulmonary disease. Electronically Signed   By: Sebastian Ache M.D.   On: 04/06/2018 19:18    Procedures Procedures (including critical care time)  Medications Ordered in UC Medications - No data to display   Initial Impression / Assessment and Plan / UC Course  I have reviewed  the triage vital signs and the nursing notes.  Pertinent labs &  imaging results that were available during my care of the patient were reviewed by me and considered in my medical decision making (see chart for details).    Chest pain noncardiac.  Exam heart and lungs are normal.  EKG is normal as is chest x-ray.  No risk factors.  Patient reassured  Final Clinical Impressions(s) / UC Diagnoses   Final diagnoses:  Chest pain, unspecified type    ED Discharge Orders    None       Controlled Substance Prescriptions [place] Controlled Substance Registry consulted? Not Applicable   Frederica Kuster, MD 04/06/18 [id number]

## 2018-04-30 ENCOUNTER — Telehealth: Payer: BC Managed Care – PPO | Admitting: Family

## 2018-04-30 DIAGNOSIS — R059 Cough, unspecified: Secondary | ICD-10-CM

## 2018-04-30 DIAGNOSIS — B9689 Other specified bacterial agents as the cause of diseases classified elsewhere: Secondary | ICD-10-CM

## 2018-04-30 DIAGNOSIS — J329 Chronic sinusitis, unspecified: Secondary | ICD-10-CM

## 2018-04-30 DIAGNOSIS — R05 Cough: Secondary | ICD-10-CM

## 2018-04-30 MED ORDER — AMOXICILLIN-POT CLAVULANATE 875-125 MG PO TABS: 1.0000 | ORAL_TABLET | Freq: Two times a day (BID) | ORAL | 0 refills | Status: AC

## 2018-04-30 MED ORDER — BENZONATATE 100 MG PO CAPS: 100.0000 mg | ORAL_CAPSULE | Freq: Three times a day (TID) | ORAL | 0 refills | Status: DC | PRN

## 2018-04-30 MED FILL — AMOX-CLAV 875-125 MG TABLET: 875-125 | 7 days supply | Qty: 14 | Fill #0

## 2018-04-30 NOTE — Progress Notes (Signed)
Thank you for the details you included in the comment boxes. Those details are very helpful in determining the best course of treatment for you and help us to provide the best care.  We are sorry that you are not feeling well.  Here is how we plan to help!  Based on what you have shared with me it looks like you have sinusitis.  Sinusitis is inflammation and infection in the sinus cavities of the head.  Based on your presentation I believe you most likely have Acute Bacterial Sinusitis.  This is an infection caused by bacteria and is treated with antibiotics. I have prescribed Augmentin 875mg/125mg one tablet twice daily with food, for 7 days. You may use an oral decongestant such as Mucinex D or if you have glaucoma or high blood pressure use plain Mucinex. Saline nasal spray help and can safely be used as often as needed for congestion.  If you develop worsening sinus pain, fever or notice severe headache and vision changes, or if symptoms are not better after completion of antibiotic, please schedule an appointment with a health care provider.    I have also sent Tessalon Perles 100mg, take 1-2 every 8 hours as needed for cough.    Sinus infections are not as easily transmitted as other respiratory infection, however we still recommend that you avoid close contact with loved ones, especially the very young and elderly.  Remember to wash your hands thoroughly throughout the day as this is the number one way to prevent the spread of infection!  Home Care:  Only take medications as instructed by your medical team.  Complete the entire course of an antibiotic.  Do not take these medications with alcohol.  A steam or ultrasonic humidifier can help congestion.  You can place a towel over your head and breathe in the steam from hot water coming from a faucet.  Avoid close contacts especially the very young and the elderly.  Cover your mouth when you cough or sneeze.  Always remember to wash your  hands.  Get Help Right Away If:  You develop worsening fever or sinus pain.  You develop a severe head ache or visual changes.  Your symptoms persist after you have completed your treatment plan.  Make sure you  Understand these instructions.  Will watch your condition.  Will get help right away if you are not doing well or get worse.  Your e-visit answers were reviewed by a board certified advanced clinical practitioner to complete your personal care plan.  Depending on the condition, your plan could have included both over the counter or prescription medications.  If there is a problem please reply  once you have received a response from your provider.  Your safety is important to us.  If you have drug allergies check your prescription carefully.    You can use MyChart to ask questions about today's visit, request a non-urgent call back, or ask for a work or school excuse for 24 hours related to this e-Visit. If it has been greater than 24 hours you will need to follow up with your provider, or enter a new e-Visit to address those concerns.  You will get an e-mail in the next two days asking about your experience.  I hope that your e-visit has been valuable and will speed your recovery. Thank you for using e-visits.     

## 2018-09-27 ENCOUNTER — Telehealth: Payer: BC Managed Care – PPO | Admitting: Family

## 2018-09-27 DIAGNOSIS — J019 Acute sinusitis, unspecified: Secondary | ICD-10-CM

## 2018-09-27 DIAGNOSIS — B9689 Other specified bacterial agents as the cause of diseases classified elsewhere: Secondary | ICD-10-CM

## 2018-09-27 MED ORDER — AMOXICILLIN-POT CLAVULANATE 875-125 MG PO TABS: 1.0000 | ORAL_TABLET | Freq: Two times a day (BID) | ORAL | 0 refills | Status: DC

## 2018-09-27 MED FILL — AMOX-CLAV 875-125 MG TABLET: 875-125 | 7 days supply | Qty: 14 | Fill #0

## 2018-09-27 NOTE — Progress Notes (Signed)

## 2018-10-11 ENCOUNTER — Other Ambulatory Visit: Payer: Self-pay | Admitting: Obstetrics & Gynecology

## 2018-10-11 DIAGNOSIS — Z1231 Encounter for screening mammogram for malignant neoplasm of breast: Secondary | ICD-10-CM

## 2018-10-28 ENCOUNTER — Ambulatory Visit
Admission: RE | Admit: 2018-10-28 | Discharge: 2018-10-28 | Disposition: A | Discharge status: Home / Self Care | Payer: BC Managed Care – PPO | Source: Ambulatory Visit

## 2018-10-28 DIAGNOSIS — Z1231 Encounter for screening mammogram for malignant neoplasm of breast: Secondary | ICD-10-CM

## 2018-11-22 ENCOUNTER — Telehealth: Payer: BC Managed Care – PPO | Admitting: Physician Assistant

## 2018-11-22 DIAGNOSIS — J018 Other acute sinusitis: Secondary | ICD-10-CM

## 2018-11-22 MED ORDER — AMOXICILLIN-POT CLAVULANATE 875-125 MG PO TABS: 1.0000 | ORAL_TABLET | Freq: Two times a day (BID) | ORAL | 0 refills | Status: DC

## 2018-11-22 MED FILL — AMOX-CLAV 875-125 MG TABLET: 875-125 | 10 days supply | Qty: 20 | Fill #0

## 2018-11-22 NOTE — Progress Notes (Signed)
We are sorry that you are not feeling well.  Here is how we plan to help!  Based on what you have shared with me it looks like you have sinusitis.  Sinusitis is inflammation and infection in the sinus cavities of the head.  Based on your presentation I believe you most likely have Acute Bacterial Sinusitis.  This is an infection caused by bacteria and is treated with antibiotics. I have prescribed Augmentin 875mg/125mg one tablet twice daily with food, for 10 days. You may use an oral decongestant such as Mucinex D or if you have glaucoma or high blood pressure use plain Mucinex. Saline nasal spray help and can safely be used as often as needed for congestion.  If you develop worsening sinus pain, fever or notice severe headache and vision changes, or if symptoms are not better after completion of antibiotic, please schedule an appointment with a health care provider.    Sinus infections are not as easily transmitted as other respiratory infection, however we still recommend that you avoid close contact with loved ones, especially the very young and elderly.  Remember to wash your hands thoroughly throughout the day as this is the number one way to prevent the spread of infection!  Home Care:  Only take medications as instructed by your medical team.  Complete the entire course of an antibiotic.  Do not take these medications with alcohol.  A steam or ultrasonic humidifier can help congestion.  You can place a towel over your head and breathe in the steam from hot water coming from a faucet.  Avoid close contacts especially the very young and the elderly.  Cover your mouth when you cough or sneeze.  Always remember to wash your hands.  Get Help Right Away If:  You develop worsening fever or sinus pain.  You develop a severe head ache or visual changes.  Your symptoms persist after you have completed your treatment plan.  Make sure you  Understand these instructions.  Will watch your  condition.  Will get help right away if you are not doing well or get worse.  Your e-visit answers were reviewed by a board certified advanced clinical practitioner to complete your personal care plan.  Depending on the condition, your plan could have included both over the counter or prescription medications.  If there is a problem please reply  once you have received a response from your provider.  Your safety is important to us.  If you have drug allergies check your prescription carefully.    You can use MyChart to ask questions about today's visit, request a non-urgent call back, or ask for a work or school excuse for 24 hours related to this e-Visit. If it has been greater than 24 hours you will need to follow up with your provider, or enter a new e-Visit to address those concerns.  You will get an e-mail in the next two days asking about your experience.  I hope that your e-visit has been valuable and will speed your recovery. Thank you for using e-visits.   

## 2020-02-03 ENCOUNTER — Other Ambulatory Visit: Payer: BC Managed Care – PPO

## 2020-11-18 ENCOUNTER — Telehealth: Payer: BC Managed Care – PPO | Admitting: Family

## 2020-11-18 DIAGNOSIS — R399 Unspecified symptoms and signs involving the genitourinary system: Secondary | ICD-10-CM | POA: Diagnosis not present

## 2020-11-18 MED ORDER — CEPHALEXIN 500 MG PO CAPS: 500.0000 mg | ORAL_CAPSULE | Freq: Two times a day (BID) | ORAL | 0 refills | Status: DC

## 2020-11-18 NOTE — Progress Notes (Signed)

## 2021-01-24 ENCOUNTER — Other Ambulatory Visit: Payer: Self-pay | Admitting: Obstetrics & Gynecology

## 2021-01-24 DIAGNOSIS — Z1231 Encounter for screening mammogram for malignant neoplasm of breast: Secondary | ICD-10-CM

## 2021-01-28 ENCOUNTER — Other Ambulatory Visit: Payer: Self-pay

## 2021-01-28 ENCOUNTER — Ambulatory Visit
Admission: RE | Admit: 2021-01-28 | Discharge: 2021-01-28 | Disposition: A | Discharge status: Home / Self Care | Payer: BC Managed Care – PPO | Source: Ambulatory Visit

## 2021-01-28 DIAGNOSIS — Z1231 Encounter for screening mammogram for malignant neoplasm of breast: Secondary | ICD-10-CM

## 2021-02-01 ENCOUNTER — Telehealth: Payer: BC Managed Care – PPO | Admitting: Physician Assistant

## 2021-02-01 DIAGNOSIS — B9689 Other specified bacterial agents as the cause of diseases classified elsewhere: Secondary | ICD-10-CM | POA: Diagnosis not present

## 2021-02-01 DIAGNOSIS — J019 Acute sinusitis, unspecified: Secondary | ICD-10-CM

## 2021-02-01 MED ORDER — AMOXICILLIN-POT CLAVULANATE 875-125 MG PO TABS: 1.0000 | ORAL_TABLET | Freq: Two times a day (BID) | ORAL | 0 refills | Status: DC

## 2021-02-01 NOTE — Progress Notes (Signed)

## 2021-02-01 NOTE — Progress Notes (Signed)
I have spent 5 minutes in review of e-visit questionnaire, review and updating patient chart, medical decision making and response to patient.   Jasmynn Pfalzgraf Cody Statia Burdick, PA-C    

## 2021-05-07 ENCOUNTER — Other Ambulatory Visit: Payer: Self-pay | Admitting: Otolaryngology

## 2021-05-07 DIAGNOSIS — J329 Chronic sinusitis, unspecified: Secondary | ICD-10-CM

## 2021-05-11 ENCOUNTER — Ambulatory Visit
Admission: RE | Admit: 2021-05-11 | Discharge: 2021-05-11 | Disposition: A | Discharge status: Home / Self Care | Payer: BC Managed Care – PPO | Source: Ambulatory Visit | Attending: Otolaryngology | Admitting: Otolaryngology

## 2021-05-11 ENCOUNTER — Other Ambulatory Visit: Payer: Self-pay

## 2021-05-11 DIAGNOSIS — J329 Chronic sinusitis, unspecified: Secondary | ICD-10-CM

## 2022-03-13 ENCOUNTER — Other Ambulatory Visit: Payer: Self-pay | Admitting: Obstetrics & Gynecology

## 2022-03-24 ENCOUNTER — Ambulatory Visit
Admission: RE | Admit: 2022-03-24 | Discharge: 2022-03-24 | Disposition: A | Discharge status: Home / Self Care | Payer: BC Managed Care – PPO | Source: Ambulatory Visit | Attending: Obstetrics & Gynecology | Admitting: Obstetrics & Gynecology

## 2022-03-24 DIAGNOSIS — Z1231 Encounter for screening mammogram for malignant neoplasm of breast: Secondary | ICD-10-CM

## 2022-04-21 ENCOUNTER — Ambulatory Visit (HOSPITAL_COMMUNITY)
Admission: RE | Admit: 2022-04-21 | Discharge: 2022-04-21 | Disposition: A | Discharge status: Home / Self Care | Payer: BC Managed Care – PPO | Source: Ambulatory Visit | Attending: Internal Medicine | Admitting: Internal Medicine

## 2022-04-21 ENCOUNTER — Telehealth: Payer: Self-pay | Admitting: Internal Medicine

## 2022-04-21 DIAGNOSIS — M7731 Calcaneal spur, right foot: Secondary | ICD-10-CM | POA: Insufficient documentation

## 2022-04-21 DIAGNOSIS — S92351A Displaced fracture of fifth metatarsal bone, right foot, initial encounter for closed fracture: Secondary | ICD-10-CM | POA: Insufficient documentation

## 2022-04-21 DIAGNOSIS — S99921S Unspecified injury of right foot, sequela: Secondary | ICD-10-CM | POA: Diagnosis present

## 2022-04-21 DIAGNOSIS — S99921A Unspecified injury of right foot, initial encounter: Secondary | ICD-10-CM | POA: Diagnosis present

## 2022-04-21 DIAGNOSIS — X500XXA Overexertion from strenuous movement or load, initial encounter: Secondary | ICD-10-CM | POA: Diagnosis not present

## 2022-04-21 NOTE — Telephone Encounter (Signed)
Worsening R foot pain after twisting injury, persistent unresponsive to RICE. Will check foot X-ray to start. ? ?Patient foot TTP along arch. ?

## 2023-02-18 ENCOUNTER — Other Ambulatory Visit: Payer: Self-pay | Admitting: Obstetrics & Gynecology

## 2023-02-18 DIAGNOSIS — Z1231 Encounter for screening mammogram for malignant neoplasm of breast: Secondary | ICD-10-CM

## 2023-04-01 ENCOUNTER — Ambulatory Visit
Admission: RE | Admit: 2023-04-01 | Discharge: 2023-04-01 | Disposition: A | Discharge status: Home / Self Care | Payer: BC Managed Care – PPO | Source: Ambulatory Visit | Attending: Obstetrics & Gynecology | Admitting: Obstetrics & Gynecology

## 2023-04-01 DIAGNOSIS — Z1231 Encounter for screening mammogram for malignant neoplasm of breast: Secondary | ICD-10-CM

## 2023-07-13 ENCOUNTER — Encounter: Payer: BC Managed Care – PPO | Admitting: Internal Medicine

## 2023-07-13 ENCOUNTER — Ambulatory Visit: Payer: BC Managed Care – PPO

## 2023-07-13 ENCOUNTER — Telehealth: Payer: Self-pay

## 2023-07-13 NOTE — Telephone Encounter (Signed)
Attempted to reach patient; unable to speak with patient; left message for patient to call back prior to 5 pm to reschedule PV appt;  if patient fails to call back prior to 5 pm today, her PV and procedure appts will be cancelled;

## 2023-07-16 ENCOUNTER — Encounter: Payer: Self-pay | Admitting: Internal Medicine

## 2023-07-31 ENCOUNTER — Encounter: Payer: BC Managed Care – PPO | Admitting: Internal Medicine

## 2023-09-15 ENCOUNTER — Ambulatory Visit (AMBULATORY_SURGERY_CENTER): Payer: BC Managed Care – PPO

## 2023-09-15 VITALS — Ht 66.0 in | Wt 180.0 lb

## 2023-09-15 DIAGNOSIS — Z1211 Encounter for screening for malignant neoplasm of colon: Secondary | ICD-10-CM

## 2023-09-15 MED ORDER — PEG 3350-KCL-NA BICARB-NACL 420 G PO SOLR
4000.0000 mL | Freq: Once | ORAL | 0 refills | Status: AC
Start: 2023-09-15 — End: 2023-09-15

## 2023-09-15 NOTE — Progress Notes (Signed)
No egg or soy allergy known to patient  No issues known to pt with past sedation with any surgeries or procedures Patient denies ever being told they had issues or difficulty with intubation  No FH of Malignant Hyperthermia Pt is not on diet pills Pt is not on  home 02  Pt is not on blood thinners  Pt denies issues with constipation takes fiber gummies No A fib or A flutter Have any cardiac testing pending--no Pt can ambulate independently Pt denies use of chewing tobacco Discussed diabetic I weight loss medication holds Discussed NSAID holds Checked BMI Pt instructed to use [employer] or GoodRx for a price reduction on prep  Patient's chart reviewed by Cathlyn Parsons CNRA prior to previsit and patient appropriate for the [facility].  Pre visit completed and red dot placed by patient's name on their procedure day (on provider's schedule).

## 2023-10-01 ENCOUNTER — Encounter: Payer: Self-pay | Admitting: Internal Medicine

## 2023-10-09 ENCOUNTER — Encounter: Payer: Self-pay | Admitting: Internal Medicine

## 2023-10-09 ENCOUNTER — Ambulatory Visit: Payer: BC Managed Care – PPO | Admitting: Internal Medicine

## 2023-10-09 VITALS — BP 118/67 | HR 70 | Temp 98.3°F | Resp 15 | Ht 66.0 in | Wt 180.0 lb

## 2023-10-09 DIAGNOSIS — D122 Benign neoplasm of ascending colon: Secondary | ICD-10-CM

## 2023-10-09 DIAGNOSIS — D124 Benign neoplasm of descending colon: Secondary | ICD-10-CM

## 2023-10-09 DIAGNOSIS — Z1211 Encounter for screening for malignant neoplasm of colon: Secondary | ICD-10-CM | POA: Diagnosis present

## 2023-10-09 MED ORDER — SODIUM CHLORIDE 0.9 % IV SOLN
500.0000 mL | Freq: Once | INTRAVENOUS | Status: DC
Start: 2023-10-09 — End: 2023-10-09

## 2023-10-09 NOTE — Patient Instructions (Signed)
Await pathology results.  Resume previous diet.  Continue present medications.  Handout on polyps provided.  YOU HAD AN ENDOSCOPIC PROCEDURE TODAY AT THE [facility]:   Refer to the procedure report that was given to you for any specific questions about what was found during the examination.  If the procedure report does not answer your questions, please call your gastroenterologist to clarify.  If you requested that your care partner not be given the details of your procedure findings, then the procedure report has been included in a sealed envelope for you to review at your convenience later.  YOU SHOULD EXPECT: Some feelings of bloating in the abdomen. Passage of more gas than usual.  Walking can help get rid of the air that was put into your GI tract during the procedure and reduce the bloating. If you had a lower endoscopy (such as a colonoscopy or flexible sigmoidoscopy) you may notice spotting of blood in your stool or on the toilet paper. If you underwent a bowel prep for your procedure, you may not have a normal bowel movement for a few days.  Please Note:  You might notice some irritation and congestion in your nose or some drainage.  This is from the oxygen used during your procedure.  There is no need for concern and it should clear up in a day or so.  SYMPTOMS TO REPORT IMMEDIATELY:  Following lower endoscopy (colonoscopy or flexible sigmoidoscopy):  Excessive amounts of blood in the stool  Significant tenderness or worsening of abdominal pains  Swelling of the abdomen that is new, acute  Fever of 100F or higher  For urgent or emergent issues, a gastroenterologist can be reached at any hour by calling [phone]. Do not use MyChart messaging for urgent concerns.    DIET:  We do recommend a small meal at first, but then you may proceed to your regular diet.  Drink plenty of fluids but you should avoid alcoholic beverages for 24 hours.  ACTIVITY:  You should  plan to take it easy for the rest of today and you should NOT DRIVE or use heavy machinery until tomorrow (because of the sedation medicines used during the test).    FOLLOW UP: Our staff will call the number listed on your records the next business day following your procedure.  We will call around 7:15- 8:00 am to check on you and address any questions or concerns that you may have regarding the information given to you following your procedure. If we do not reach you, we will leave a message.     If any biopsies were taken you will be contacted by phone or by letter within the next 1-3 weeks.  Please call us at [phone] if you have not heard about the biopsies in 3 weeks.    SIGNATURES/CONFIDENTIALITY: You and/or your care partner have signed paperwork which will be entered into your electronic medical record.  These signatures attest to the fact that that the information above on your After Visit Summary has been reviewed and is understood.  Full responsibility of the confidentiality of this discharge information lies with you and/or your care-partner.

## 2023-10-09 NOTE — Progress Notes (Signed)
 VS by KD  Pt's states no medical or surgical changes since previsit or office visit.

## 2023-10-09 NOTE — Progress Notes (Signed)
Called to room to assist during endoscopic procedure.  Patient ID and intended procedure confirmed with present staff. Received instructions for my participation in the procedure from the performing physician.  

## 2023-10-09 NOTE — Progress Notes (Signed)
Report to PACU, RN, vss, BBS= Clear.  

## 2023-10-09 NOTE — Op Note (Signed)
[facility] Patient Name: [name] Procedure Date: 10/09/2023 9:00 AM MRN: [id number] Endoscopist: Wilhemina Bonito. Marina Goodell , MD, [id number] Age: 52 Referring MD:  Date of Birth: 07-20-1971 Gender: Female Account #: [id number] Procedure:                Colonoscopy with cold snare polypectomy x 1; biopsy                            polypectomy x 1 Indications:              Screening for colorectal malignant neoplasm Medicines:                Monitored Anesthesia Care Procedure:                Pre-Anesthesia Assessment:                           - Prior to the procedure, a History and Physical                            was performed, and patient medications and                            allergies were reviewed. The patient's tolerance of                            previous anesthesia was also reviewed. The risks                            and benefits of the procedure and the sedation                            options and risks were discussed with the patient.                            All questions were answered, and informed consent                            was obtained. Prior Anticoagulants: The patient has                            taken no anticoagulant or antiplatelet agents. ASA                            Grade Assessment: I - A normal, healthy patient.                            After reviewing the risks and benefits, the patient                            was deemed in satisfactory condition to undergo the                            procedure.  After obtaining informed consent, the colonoscope                            was passed under direct vision. Throughout the                            procedure, the patient's blood pressure, pulse, and                            oxygen saturations were monitored continuously. The                            Olympus Scope SN: [id number] was introduced through                            the anus and  advanced to the the cecum, identified                            by appendiceal orifice and ileocecal valve. The                            ileocecal valve, appendiceal orifice, and rectum                            were photographed. The quality of the bowel                            preparation was excellent. The colonoscopy was                            performed without difficulty. The patient tolerated                            the procedure well. The bowel preparation used was                            SUPREP via split dose instruction. Scope In: 9:11:59 AM Scope Out: 9:28:35 AM Scope Withdrawal Time: 0 hours 10 minutes 26 seconds  Total Procedure Duration: 0 hours 16 minutes 36 seconds  Findings:                 A 4 mm polyp was found in the descending colon. The                            polyp was removed with a cold snare. Resection and                            retrieval were complete.                           A 1 mm polyp was found in the ascending colon. The                            polyp was  removed with a jumbo cold forceps.                            Resection and retrieval were complete.                           A diffuse area of severe melanosis was found in the                            entire colon.                           The exam was otherwise without abnormality on                            direct and retroflexion views. Complications:            No immediate complications. Estimated blood loss:                            None. Estimated Blood Loss:     Estimated blood loss: none. Impression:               - One 4 mm polyp in the descending colon, removed                            with a cold snare. Resected and retrieved.                           - One 1 mm polyp in the ascending colon, removed                            with a jumbo cold forceps. Resected and retrieved.                           - Melanosis in the colon.                            - The examination was otherwise normal on direct                            and retroflexion views. Recommendation:           - Repeat colonoscopy in 7-10 years for surveillance.                           - Patient has a contact number available for                            emergencies. The signs and symptoms of potential                            delayed complications were discussed with the                            patient. Return to  normal activities tomorrow.                            Written discharge instructions were provided to the                            patient.                           - Resume previous diet.                           - Continue present medications.                           - Await pathology results. Wilhemina Bonito. Marina Goodell, MD 10/09/2023 9:33:14 AM This report has been signed electronically.

## 2023-10-09 NOTE — Progress Notes (Signed)
HISTORY OF PRESENT ILLNESS:  [name] is a 52 y.o. female who is sent today for routine screening colonoscopy.  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 She has never used smokeless tobacco. She reports that she does not drink alcohol and does not use drugs.  family history includes Breast cancer in her maternal grandmother; Colon cancer in her maternal grandfather; Diabetes in her maternal aunt and paternal grandmother; Heart disease in her father; Hypertension in her father and mother; Lung cancer in her paternal grandfather; Osteoporosis in her maternal grandmother and mother.  No Known Allergies     PHYSICAL EXAMINATION: Vital signs: BP 132/75   Pulse 74   Temp 98.3 F (36.8 C)   Ht 5\' 6"  (1.676 m)   Wt 180 lb (81.6 kg)   LMP 05/29/2009   SpO2 99%   BMI 29.05 kg/m  General: Well-developed, well-nourished, no acute distress HEENT: Sclerae are anicteric, conjunctiva pink. Oral mucosa intact Lungs: Clear Heart: Regular Abdomen: soft, nontender, nondistended, no obvious ascites, no peritoneal signs, normal bowel sounds. No organomegaly. Extremities: No edema Psychiatric: alert and oriented x3. Cooperative     ASSESSMENT:  Colon cancer screening   PLAN:   Screening colonoscopy

## 2023-10-12 ENCOUNTER — Telehealth: Payer: Self-pay | Admitting: *Deleted

## 2023-10-12 NOTE — Telephone Encounter (Signed)
  Follow up Call-     10/09/2023    7:55 AM  Call back number  Post procedure Call Back phone  # [phone]  Permission to leave phone message Yes     Patient questions:  Do you have a fever, pain , or abdominal swelling? No. Pain Score  0 *  Have you tolerated food without any problems? Yes.    Have you been able to return to your normal activities? Yes.    Do you have any questions about your discharge instructions: Diet   No. Medications  No. Follow up visit  No.  Do you have questions or concerns about your Care? No.  Actions: * If pain score is 4 or above: No action needed, pain <4.

## 2023-10-13 ENCOUNTER — Encounter: Payer: Self-pay | Admitting: Internal Medicine

## 2023-10-13 LAB — SURGICAL PATHOLOGY
